# Patient Record
Sex: Male | Born: 1950
Health system: Southern US, Community
[De-identification: ages and names within clinical notes are randomized; demographics above are authoritative.]

## PROBLEM LIST (undated history)

## (undated) DIAGNOSIS — R6882 Decreased libido: Secondary | ICD-10-CM

## (undated) DIAGNOSIS — E119 Type 2 diabetes mellitus without complications: Secondary | ICD-10-CM

## (undated) DIAGNOSIS — E785 Hyperlipidemia, unspecified: Secondary | ICD-10-CM

## (undated) DIAGNOSIS — I10 Essential (primary) hypertension: Secondary | ICD-10-CM

## (undated) HISTORY — DX: Decreased libido: R68.82

## (undated) HISTORY — PX: NO PAST SURGERIES: SHX2092

---

## 2008-10-01 DIAGNOSIS — E7800 Pure hypercholesterolemia, unspecified: Secondary | ICD-10-CM | POA: Insufficient documentation

## 2013-04-10 LAB — HEMOGLOBIN A1C: HEMOGLOBIN A1C: 7.2

## 2015-06-27 ENCOUNTER — Emergency Department
Admission: EM | Admit: 2015-06-27 | Discharge: 2015-06-27 | Disposition: A | Discharge status: Home / Self Care | Payer: PRIVATE HEALTH INSURANCE | Attending: Emergency Medicine | Admitting: Emergency Medicine

## 2015-06-27 ENCOUNTER — Encounter: Payer: Self-pay | Admitting: Emergency Medicine

## 2015-06-27 DIAGNOSIS — Z87891 Personal history of nicotine dependence: Secondary | ICD-10-CM | POA: Diagnosis not present

## 2015-06-27 DIAGNOSIS — S199XXA Unspecified injury of neck, initial encounter: Secondary | ICD-10-CM | POA: Diagnosis present

## 2015-06-27 DIAGNOSIS — E119 Type 2 diabetes mellitus without complications: Secondary | ICD-10-CM | POA: Insufficient documentation

## 2015-06-27 DIAGNOSIS — Y998 Other external cause status: Secondary | ICD-10-CM | POA: Diagnosis not present

## 2015-06-27 DIAGNOSIS — S161XXA Strain of muscle, fascia and tendon at neck level, initial encounter: Secondary | ICD-10-CM | POA: Diagnosis not present

## 2015-06-27 DIAGNOSIS — S39012A Strain of muscle, fascia and tendon of lower back, initial encounter: Secondary | ICD-10-CM | POA: Insufficient documentation

## 2015-06-27 DIAGNOSIS — I10 Essential (primary) hypertension: Secondary | ICD-10-CM | POA: Diagnosis not present

## 2015-06-27 DIAGNOSIS — Y92410 Unspecified street and highway as the place of occurrence of the external cause: Secondary | ICD-10-CM | POA: Insufficient documentation

## 2015-06-27 DIAGNOSIS — Y9389 Activity, other specified: Secondary | ICD-10-CM | POA: Insufficient documentation

## 2015-06-27 HISTORY — DX: Hyperlipidemia, unspecified: E78.5

## 2015-06-27 HISTORY — DX: Type 2 diabetes mellitus without complications: E11.9

## 2015-06-27 HISTORY — DX: Essential (primary) hypertension: I10

## 2015-06-27 MED ORDER — NAPROXEN 500 MG PO TABS: 500.0000 mg | ORAL_TABLET | Freq: Two times a day (BID) | ORAL | Status: DC

## 2015-06-27 MED ORDER — CYCLOBENZAPRINE HCL 5 MG PO TABS: 5.0000 mg | ORAL_TABLET | Freq: Three times a day (TID) | ORAL | Status: DC | PRN
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
## 2015-06-27 NOTE — Discharge Instructions (Signed)
Cervical Sprain A cervical sprain is when the tissues (ligaments) that hold the neck bones in place stretch or tear. HOME CARE   Put ice on the injured area.  Put ice in a plastic bag.  Place a towel between your skin and the bag.  Leave the ice on for 15-20 minutes, 3-4 times a day.  You may have been given a collar to wear. This collar keeps your neck from moving while you heal.  Do not take the collar off unless told by your doctor.  If you have long hair, keep it outside of the collar.  Ask your doctor before changing the position of your collar. You may need to change its position over time to make it more comfortable.  If you are allowed to take off the collar for cleaning or bathing, follow your doctor's instructions on how to do it safely.  Keep your collar clean by wiping it with mild soap and water. Dry it completely. If the collar has removable pads, remove them every 1-2 days to hand wash them with soap and water. Allow them to air dry. They should be dry before you wear them in the collar.  Do not drive while wearing the collar.  Only take medicine as told by your doctor.  Keep all doctor visits as told.  Keep all physical therapy visits as told.  Adjust your work station so that you have good posture while you work.  Avoid positions and activities that make your problems worse.  Warm up and stretch before being active. GET HELP IF:  Your pain is not controlled with medicine.  You cannot take less pain medicine over time as planned.  Your activity level does not improve as expected. GET HELP RIGHT AWAY IF:   You are bleeding.  Your stomach is upset.  You have an allergic reaction to your medicine.  You develop new problems that you cannot explain.  You lose feeling (become numb) or you cannot move any part of your body (paralysis).  You have tingling or weakness in any part of your body.  Your symptoms get worse. Symptoms include:  Pain,  soreness, stiffness, puffiness (swelling), or a burning feeling in your neck.  Pain when your neck is touched.  Shoulder or upper back pain.  Limited ability to move your neck.  Headache.  Dizziness.  Your hands or arms feel week, lose feeling, or tingle.  Muscle spasms.  Difficulty swallowing or chewing. MAKE SURE YOU:   Understand these instructions.  Will watch your condition.  Will get help right away if you are not doing well or get worse. Document Released: 04/26/2008 Document Revised: 07/11/2013 Document Reviewed: 05/16/2013 [facility] Patient Information 2015 [place], [place]. This information is not intended to replace advice given to you by your health care provider. Make sure you discuss any questions you have with your health care provider.  Lumbosacral Strain Lumbosacral strain is a strain of any of the parts that make up your lumbosacral vertebrae. Your lumbosacral vertebrae are the bones that make up the lower third of your backbone. Your lumbosacral vertebrae are held together by muscles and tough, fibrous tissue (ligaments).  CAUSES  A sudden blow to your back can cause lumbosacral strain. Also, anything that causes an excessive stretch of the muscles in the low back can cause this strain. This is typically seen when people exert themselves strenuously, fall, lift heavy objects, bend, or crouch repeatedly. RISK FACTORS  Physically demanding work.  Participation in pushing  or pulling sports or sports that require a sudden twist of the back (tennis, golf, baseball).  Weight lifting.  Excessive lower back curvature.  Forward-tilted pelvis.  Weak back or abdominal muscles or both.  Tight hamstrings. SIGNS AND SYMPTOMS  Lumbosacral strain may cause pain in the area of your injury or pain that moves (radiates) down your leg.  DIAGNOSIS Your health care provider can often diagnose lumbosacral strain through a physical exam. In some cases, you may need tests  such as X-ray exams.  TREATMENT  Treatment for your lower back injury depends on many factors that your clinician will have to evaluate. However, most treatment will include the use of anti-inflammatory medicines. HOME CARE INSTRUCTIONS   Avoid hard physical activities (tennis, racquetball, waterskiing) if you are not in proper physical condition for it. This may aggravate or create problems.  If you have a back problem, avoid sports requiring sudden body movements. Swimming and walking are generally safer activities.  Maintain good posture.  Maintain a healthy weight.  For acute conditions, you may put ice on the injured area.  Put ice in a plastic bag.  Place a towel between your skin and the bag.  Leave the ice on for 20 minutes, 2-3 times a day.  When the low back starts healing, stretching and strengthening exercises may be recommended. SEEK MEDICAL CARE IF:  Your back pain is getting worse.  You experience severe back pain not relieved with medicines. SEEK IMMEDIATE MEDICAL CARE IF:   You have numbness, tingling, weakness, or problems with the use of your arms or legs.  There is a change in bowel or bladder control.  You have increasing pain in any area of the body, including your belly (abdomen).  You notice shortness of breath, dizziness, or feel faint.  You feel sick to your stomach (nauseous), are throwing up (vomiting), or become sweaty.  You notice discoloration of your toes or legs, or your feet get very cold. MAKE SURE YOU:   Understand these instructions.  Will watch your condition.  Will get help right away if you are not doing well or get worse. Document Released: 08/18/2005 Document Revised: 11/13/2013 Document Reviewed: 06/27/2013 [facility] Patient Information 2015 [place], [place]. This information is not intended to replace advice given to you by your health care provider. Make sure you discuss any questions you have with your health care  provider.

## 2015-06-27 NOTE — ED Notes (Signed)
NAD noted at time of D/C. Pt denies questions or concerns. Pt ambulatory to the lobby at this time.  Pt refused wheelchair to the lobby at this time.  

## 2015-08-04 ENCOUNTER — Other Ambulatory Visit: Payer: Self-pay | Admitting: Family Medicine

## 2015-10-07 ENCOUNTER — Other Ambulatory Visit: Payer: Self-pay | Admitting: Family Medicine

## 2015-10-14 ENCOUNTER — Other Ambulatory Visit: Payer: Self-pay | Admitting: Family Medicine

## 2015-11-10 ENCOUNTER — Other Ambulatory Visit: Payer: Self-pay | Admitting: Family Medicine

## 2015-11-20 ENCOUNTER — Other Ambulatory Visit: Payer: Self-pay | Admitting: Family Medicine

## 2015-11-20 ENCOUNTER — Telehealth: Payer: Self-pay | Admitting: Family Medicine

## 2015-11-20 NOTE — Telephone Encounter (Signed)
Last seen 10-01-14: patient is requesting a refill on amlodipine, lisinopril and lovastatin. Stated that he is about to [employer]. I informed him that he need to schedule appointment and he said he will at a later date but said he really need his medication. Please send to [facility]

## 2015-11-27 MED ORDER — LOVASTATIN 40 MG PO TABS: 40.0000 mg | ORAL_TABLET | Freq: Every day | ORAL | Status: DC

## 2015-11-27 MED ORDER — LISINOPRIL-HYDROCHLOROTHIAZIDE 20-25 MG PO TABS: 1.0000 | ORAL_TABLET | Freq: Every day | ORAL | Status: DC

## 2015-11-27 MED ORDER — AMLODIPINE BESYLATE 5 MG PO TABS: 5.0000 mg | ORAL_TABLET | Freq: Every day | ORAL | Status: DC

## 2015-11-27 NOTE — Telephone Encounter (Signed)
Patient  notified he must be seen before any additional refills on any medication 30 days only refill

## 2015-12-08 ENCOUNTER — Other Ambulatory Visit: Payer: Self-pay | Admitting: Family Medicine

## 2016-04-19 ENCOUNTER — Other Ambulatory Visit: Payer: Self-pay | Admitting: Family Medicine

## 2016-04-20 ENCOUNTER — Encounter: Payer: Self-pay | Admitting: Family Medicine

## 2016-04-20 ENCOUNTER — Ambulatory Visit (INDEPENDENT_AMBULATORY_CARE_PROVIDER_SITE_OTHER): Payer: Commercial Managed Care - HMO | Admitting: Family Medicine

## 2016-04-20 VITALS — BP 158/94 | HR 94 | Temp 98.2°F | Resp 18 | Ht 73.0 in | Wt 248.8 lb

## 2016-04-20 DIAGNOSIS — Z125 Encounter for screening for malignant neoplasm of prostate: Secondary | ICD-10-CM | POA: Diagnosis not present

## 2016-04-20 DIAGNOSIS — E1165 Type 2 diabetes mellitus with hyperglycemia: Secondary | ICD-10-CM

## 2016-04-20 DIAGNOSIS — I10 Essential (primary) hypertension: Secondary | ICD-10-CM | POA: Diagnosis not present

## 2016-04-20 DIAGNOSIS — Z23 Encounter for immunization: Secondary | ICD-10-CM

## 2016-04-20 DIAGNOSIS — E7800 Pure hypercholesterolemia, unspecified: Secondary | ICD-10-CM

## 2016-04-20 DIAGNOSIS — Z1211 Encounter for screening for malignant neoplasm of colon: Secondary | ICD-10-CM | POA: Diagnosis not present

## 2016-04-20 DIAGNOSIS — Z87891 Personal history of nicotine dependence: Secondary | ICD-10-CM

## 2016-04-20 LAB — POCT UA - MICROALBUMIN: Microalbumin Ur, POC: 100 mg/L

## 2016-04-20 MED ORDER — METFORMIN HCL 1000 MG PO TABS: 1000.0000 mg | ORAL_TABLET | Freq: Two times a day (BID) | ORAL | Status: DC

## 2016-04-20 MED ORDER — LOVASTATIN 40 MG PO TABS: 40.0000 mg | ORAL_TABLET | Freq: Every day | ORAL | Status: DC

## 2016-04-20 MED ORDER — GLIPIZIDE ER 5 MG PO TB24: 5.0000 mg | ORAL_TABLET | Freq: Every day | ORAL | Status: DC

## 2016-04-20 MED ORDER — LISINOPRIL-HYDROCHLOROTHIAZIDE 20-25 MG PO TABS: 1.0000 | ORAL_TABLET | Freq: Every day | ORAL | Status: DC

## 2016-04-20 MED ORDER — AMLODIPINE BESYLATE 5 MG PO TABS: 5.0000 mg | ORAL_TABLET | Freq: Every day | ORAL | Status: DC

## 2016-04-20 NOTE — Progress Notes (Signed)
Name: Austin Ortega   MRN: [id number]    DOB: [birth year]   Date:04/20/2016       Progress Note  Subjective  Chief Complaint  Chief Complaint  Patient presents with  . Medication Refill    DMV Forms to be filled out and needs refill of medications   . Diabetes    Checks sugar periodically and has not had a medication refill in the past 3 months, in the middle of private insurance to Medicare  . Hypertension    Last time he had BP Medication was in February 2017 for a 30 day supply,  . Hyperlipidemia    HPI  DMV forms : explained to the patient that since he has been out of medication and not seen for months. We will get labs and bring him back to fill out the forms at a later date  DMII: he was taking Janumet, but has been out of medications for at least 4 months. He has noticed nocturia - gets up about 3 times per night. He denies polydipsia or polyphagia. He has not been checking glucose at home. He denies blurred vision. He is taking aspirin daily and is on ace, due for urine micro  HTN: bp is out of control today, but has been out of mediation for at least 4 months, he denies headaches or chest pain  Hyperlipidemia: used to take Mevacor but also out of medication for months.    Patient Active Problem List   Diagnosis Date Noted  . Adiposity 04/20/2016  . Decreased libido 03/04/2009  . Pure hypercholesterolemia 10/01/2008  . Benign essential HTN 08/27/2008  . Type 2 diabetes mellitus with hyperglycemia ([place]) 08/27/2008    History reviewed. No pertinent past surgical history.  Family History  Problem Relation Age of Onset  . Diabetes Mother   . Hypertension Mother   . Cancer Father     Lung  . Hypertension Sister     1 of the patient sister's  . Diabetes Sister     1 of the pateint sisters  . Hypertension Brother     2    Social History   Social History  . Marital Status: Married    Spouse Name: N/A  . Number of Children: N/A  . Years of Education: N/A    Occupational History  . Not on file.   Social History Main Topics  . Smoking status: Former Smoker -- 0.50 packs/day for 3 years    Types: Cigarettes    Start date: 04/20/1978    Quit date: 04/20/1981  . Smokeless tobacco: Never Used  . Alcohol Use: No  . Drug Use: No  . Sexual Activity:    Partners: Female   Other Topics Concern  . Not on file   Social History Narrative     Current outpatient prescriptions:  .  amLODipine (NORVASC) 5 MG tablet, Take 1 tablet (5 mg total) by mouth daily. Patient must be seen before any additional refills on all medications, Disp: 30 tablet, Rfl: 0 .  aspirin (ASPIRIN ADULT LOW DOSE) 81 MG EC tablet, Take by mouth., Disp: , Rfl:  .  Blood Glucose Monitoring Suppl (FREESTYLE FREEDOM LITE) w/Device KIT, , Disp: , Rfl:  .  lisinopril-hydrochlorothiazide (PRINZIDE,ZESTORETIC) 20-25 MG tablet, Take 1 tablet by mouth daily., Disp: 30 tablet, Rfl: 0 .  lovastatin (MEVACOR) 40 MG tablet, Take 1 tablet (40 mg total) by mouth at bedtime., Disp: 30 tablet, Rfl: 0 .  glipiZIDE (GLUCOTROL XL) 5 MG 24  hr tablet, Take 1 tablet (5 mg total) by mouth daily with breakfast., Disp: 30 tablet, Rfl: 0 .  metFORMIN (GLUCOPHAGE) 1000 MG tablet, Take 1 tablet (1,000 mg total) by mouth 2 (two) times daily with a meal., Disp: 60 tablet, Rfl: 0  Allergies  Allergen Reactions  . Shrimp [Shellfish Allergy] Swelling     ROS  Ten systems reviewed and is negative except as mentioned in HPI  Objective  Filed Vitals:   04/20/16 1208  BP: 158/94  Pulse: 94  Temp: 98.2 F (36.8 C)  TempSrc: Oral  Resp: 18  Height: '6\' 1"'  (1.854 m)  Weight: 248 lb 12.8 oz (112.855 kg)  SpO2: 97%    Body mass index is 32.83 kg/(m^2).  Physical Exam  Constitutional: Patient appears well-developed and well-nourished. Obese No distress.  HEENT: head atraumatic, normocephalic, pupils equal and reactive to light,  neck supple, throat within normal limits Cardiovascular: Normal  rate, regular rhythm and normal heart sounds.  No murmur heard. No BLE edema. Pulmonary/Chest: Effort normal and breath sounds normal. No respiratory distress. Abdominal: Soft.  There is no tenderness. Psychiatric: Patient has a normal mood and affect. behavior is normal. Judgment and thought content normal.  Diabetic Foot Exam: Diabetic Foot Exam - Simple   Simple Foot Form  Diabetic Foot exam was performed with the following findings:  Yes 04/20/2016 12:39 PM  Visual Inspection  No deformities, no ulcerations, no other skin breakdown bilaterally:  Yes  Sensation Testing  Intact to touch and monofilament testing bilaterally:  Yes  Pulse Check  Posterior Tibialis and Dorsalis pulse intact bilaterally:  Yes  Comments       PHQ2/9: Depression screen PHQ 2/9 04/20/2016  Decreased Interest 0  Down, Depressed, Hopeless 0  PHQ - 2 Score 0     Fall Risk: Fall Risk  04/20/2016  Falls in the past year? No      Functional Status Survey: Is the patient deaf or have difficulty hearing?: No Does the patient have difficulty seeing, even when wearing glasses/contacts?: No Does the patient have difficulty concentrating, remembering, or making decisions?: No Does the patient have difficulty walking or climbing stairs?: No Does the patient have difficulty dressing or bathing?: No Does the patient have difficulty doing errands alone such as visiting a doctor's office or shopping?: No    Assessment & Plan  1. Benign essential HTN  Resume medication, discussed importance of compliance - Comprehensive metabolic panel - CBC with Differential/Platelet - lisinopril-hydrochlorothiazide (PRINZIDE,ZESTORETIC) 20-25 MG tablet; Take 1 tablet by mouth daily.  Dispense: 30 tablet; Refill: 0 - amLODipine (NORVASC) 5 MG tablet; Take 1 tablet (5 mg total) by mouth daily. Patient must be seen before any additional refills on all medications  Dispense: 30 tablet; Refill: 0  2. Pure  hypercholesterolemia  - Lipid panel - lovastatin (MEVACOR) 40 MG tablet; Take 1 tablet (40 mg total) by mouth at bedtime.  Dispense: 30 tablet; Refill: 0  3. Type 2 diabetes mellitus with hyperglycemia, without long-term current use of insulin (HCC)  - Hemoglobin A1c - POCT UA - Microalbumin - metFORMIN (GLUCOPHAGE) 1000 MG tablet; Take 1 tablet (1,000 mg total) by mouth 2 (two) times daily with a meal.  Dispense: 60 tablet; Refill: 0 - glipiZIDE (GLUCOTROL XL) 5 MG 24 hr tablet; Take 1 tablet (5 mg total) by mouth daily with breakfast.  Dispense: 30 tablet; Refill: 0  4. Need for pneumococcal vaccination  - Pneumococcal polysaccharide vaccine 23-valent greater than or equal to 2yo  subcutaneous/IM  5. Need for shingles vaccine  - Varicella-zoster vaccine subcutaneous - not given because of cost  6. Colon cancer screening  - Ambulatory referral to Gastroenterology  7. History of tobacco use  - US ABDOMINAL AORTA SCREENING AAA; Future

## 2016-04-21 ENCOUNTER — Encounter: Payer: Self-pay | Admitting: Family Medicine

## 2016-04-21 ENCOUNTER — Ambulatory Visit (INDEPENDENT_AMBULATORY_CARE_PROVIDER_SITE_OTHER): Payer: Commercial Managed Care - HMO | Admitting: Family Medicine

## 2016-04-21 VITALS — BP 142/76 | HR 98 | Temp 98.2°F | Resp 18 | Ht 73.0 in | Wt 247.7 lb

## 2016-04-21 DIAGNOSIS — I10 Essential (primary) hypertension: Secondary | ICD-10-CM

## 2016-04-21 DIAGNOSIS — R809 Proteinuria, unspecified: Secondary | ICD-10-CM

## 2016-04-21 DIAGNOSIS — N481 Balanitis: Secondary | ICD-10-CM | POA: Diagnosis not present

## 2016-04-21 DIAGNOSIS — Z125 Encounter for screening for malignant neoplasm of prostate: Secondary | ICD-10-CM

## 2016-04-21 DIAGNOSIS — Z0000 Encounter for general adult medical examination without abnormal findings: Secondary | ICD-10-CM

## 2016-04-21 DIAGNOSIS — L80 Vitiligo: Secondary | ICD-10-CM | POA: Diagnosis not present

## 2016-04-21 DIAGNOSIS — E1129 Type 2 diabetes mellitus with other diabetic kidney complication: Secondary | ICD-10-CM | POA: Diagnosis not present

## 2016-04-21 DIAGNOSIS — K429 Umbilical hernia without obstruction or gangrene: Secondary | ICD-10-CM

## 2016-04-21 LAB — COMPREHENSIVE METABOLIC PANEL
ALBUMIN: 4.2 g/dL (ref 3.6–4.8)
ALK PHOS: 110 IU/L (ref 39–117)
ALT: 22 IU/L (ref 0–44)
AST: 19 IU/L (ref 0–40)
Albumin/Globulin Ratio: 1.4 (ref 1.2–2.2)
BILIRUBIN TOTAL: 0.7 mg/dL (ref 0.0–1.2)
BUN/Creatinine Ratio: 12 (ref 10–24)
BUN: 17 mg/dL (ref 8–27)
CO2: 22 mmol/L (ref 18–29)
Calcium: 9.4 mg/dL (ref 8.6–10.2)
Chloride: 99 mmol/L (ref 96–106)
Creatinine, Ser: 1.44 mg/dL — ABNORMAL HIGH (ref 0.76–1.27)
GFR calc non Af Amer: 51 mL/min/{1.73_m2} — ABNORMAL LOW (ref >59)
GFR, EST AFRICAN AMERICAN: 58 mL/min/{1.73_m2} — AB (ref >59)
GLUCOSE: 274 mg/dL — AB (ref 65–99)
Globulin, Total: 2.9 g/dL (ref 1.5–4.5)
POTASSIUM: 4.4 mmol/L (ref 3.5–5.2)
Sodium: 138 mmol/L (ref 134–144)
Total Protein: 7.1 g/dL (ref 6.0–8.5)

## 2016-04-21 LAB — CBC WITH DIFFERENTIAL/PLATELET
BASOS ABS: 0 10*3/uL (ref 0.0–0.2)
Basos: 0 %
EOS (ABSOLUTE): 0.2 10*3/uL (ref 0.0–0.4)
Eos: 3 %
HEMOGLOBIN: 13.7 g/dL (ref 12.6–17.7)
Hematocrit: 42.4 % (ref 37.5–51.0)
Immature Grans (Abs): 0 10*3/uL (ref 0.0–0.1)
Immature Granulocytes: 0 %
LYMPHS ABS: 1.3 10*3/uL (ref 0.7–3.1)
Lymphs: 27 %
MCH: 27.2 pg (ref 26.6–33.0)
MCHC: 32.3 g/dL (ref 31.5–35.7)
MCV: 84 fL (ref 79–97)
MONOCYTES: 9 %
Monocytes Absolute: 0.5 10*3/uL (ref 0.1–0.9)
NEUTROS ABS: 3 10*3/uL (ref 1.4–7.0)
Neutrophils: 61 %
Platelets: 208 10*3/uL (ref 150–379)
RBC: 5.04 x10E6/uL (ref 4.14–5.80)
RDW: 14.2 % (ref 12.3–15.4)
WBC: 4.9 10*3/uL (ref 3.4–10.8)

## 2016-04-21 LAB — HEMOGLOBIN A1C
ESTIMATED AVERAGE GLUCOSE: 369 mg/dL
HEMOGLOBIN A1C: 14.5 % — AB (ref 4.8–5.6)

## 2016-04-21 LAB — LIPID PANEL
CHOL/HDL RATIO: 4.3 ratio (ref 0.0–5.0)
Cholesterol, Total: 230 mg/dL — ABNORMAL HIGH (ref 100–199)
HDL: 54 mg/dL (ref >39)
LDL Calculated: 154 mg/dL — ABNORMAL HIGH (ref 0–99)
TRIGLYCERIDES: 109 mg/dL (ref 0–149)
VLDL Cholesterol Cal: 22 mg/dL (ref 5–40)

## 2016-04-21 MED ORDER — FLUCONAZOLE 150 MG PO TABS: 150.0000 mg | ORAL_TABLET | ORAL | Status: DC

## 2016-04-21 NOTE — Progress Notes (Signed)
Name: Austin Ortega   MRN: [id number]    DOB: 1950-12-24   Date:04/21/2016       Progress Note  Subjective  Chief Complaint  Chief Complaint  Patient presents with  . Medicare Wellness  . Diabetes  . Hypertension    HPI  Functional ability/safety issues: No Issues Hearing issues: Addressed  Activities of daily living: Discussed Home safety issues: No Issues  End Of Life Planning: Offered verbal information regarding advanced directives, healthcare power of attorney.  Preventative care, Health maintenance, Preventative health measures discussed.  Preventative screenings discussed today: lab work, colonoscopy, PSA.  Men age 64 to 12 years if ever smoked recommended to get a one time AAA ultrasound screening exam.  Low Dose CT Chest recommended if Age 54-80 years, 30 pack-year currently smoking OR have quit w/in 15years.   Lifestyle risk factor issued reviewed: Diet, exercise, weight management, advised patient smoking is not healthy, nutrition/diet.  Preventative health measures discussed (5-10 year plan).  Reviewed and recommended vaccinations: - Pneumovax  - Prevnar  - Annual Influenza - Zostavax - Tdap   Depression screening: Done Fall risk screening: Done Discuss ADLs/IADLs: Done  Current medical providers: See HPI  Other health risk factors identified this visit: No other issues Cognitive impairment issues: None identified  All above discussed with patient. Appropriate education, counseling and referral will be made based upon the above.    DMII:seen yesterday and hgbA1C is over 14. He was out of medications for months. He had nocturia, but no polydipsia or polyphagia. He states since he ran out of medication he has been having sweet beverages about 3 times daily, not compliant with diabetic diet, but he knows what is appropriate ( he had a 6 week class at church about 2 months ago). He is afraid of insulin shots, but will change his diet and will take  medications until his follow up.   HTN: he is now back on medication, bp has improved, continue medication, no chest pain or palpitation, denies decrease in exercise tolerance  Hyperlipidemia:LDL out of control, he has resume statin therapy, needs to take aspirin daily   Patient Active Problem List   Diagnosis Date Noted  . Adiposity 04/20/2016  . Decreased libido 03/04/2009  . Pure hypercholesterolemia 10/01/2008  . Benign essential HTN 08/27/2008  . Type 2 diabetes mellitus with microalbuminuria ([place]) 08/27/2008    History reviewed. No pertinent past surgical history.  Family History  Problem Relation Age of Onset  . Diabetes Mother   . Hypertension Mother   . Cancer Father     Lung  . Hypertension Sister     1 of the patient sister's  . Diabetes Sister     1 of the pateint sisters  . Hypertension Brother     2    Social History   Social History  . Marital Status: Married    Spouse Name: N/A  . Number of Children: N/A  . Years of Education: N/A   Occupational History  . Not on file.   Social History Main Topics  . Smoking status: Former Smoker -- 0.50 packs/day for 3 years    Types: Cigarettes    Start date: 04/20/1978    Quit date: 04/20/1981  . Smokeless tobacco: Never Used  . Alcohol Use: No  . Drug Use: No  . Sexual Activity:    Partners: Female   Other Topics Concern  . Not on file   Social History Narrative     Current outpatient prescriptions:  .  amLODipine (NORVASC) 5 MG tablet, Take 1 tablet (5 mg total) by mouth daily. Patient must be seen before any additional refills on all medications, Disp: 30 tablet, Rfl: 0 .  aspirin (ASPIRIN ADULT LOW DOSE) 81 MG EC tablet, Take by mouth., Disp: , Rfl:  .  Blood Glucose Monitoring Suppl (FREESTYLE FREEDOM LITE) w/Device KIT, , Disp: , Rfl:  .  glipiZIDE (GLUCOTROL XL) 5 MG 24 hr tablet, Take 1 tablet (5 mg total) by mouth daily with breakfast., Disp: 30 tablet, Rfl: 0 .   lisinopril-hydrochlorothiazide (PRINZIDE,ZESTORETIC) 20-25 MG tablet, Take 1 tablet by mouth daily., Disp: 30 tablet, Rfl: 0 .  lovastatin (MEVACOR) 40 MG tablet, Take 1 tablet (40 mg total) by mouth at bedtime., Disp: 30 tablet, Rfl: 0 .  metFORMIN (GLUCOPHAGE) 1000 MG tablet, Take 1 tablet (1,000 mg total) by mouth 2 (two) times daily with a meal., Disp: 60 tablet, Rfl: 0  Allergies  Allergen Reactions  . Shrimp [Shellfish Allergy] Swelling     ROS  Constitutional: Negative for fever or weight change.  Respiratory: Negative for cough and shortness of breath.   Cardiovascular: Negative for chest pain or palpitations.  Gastrointestinal: Negative for abdominal pain, no bowel changes.  Musculoskeletal: Negative for gait problem or joint swelling.  Skin: Negative for rash.  Neurological: Negative for dizziness or headache.  No other specific complaints in a complete review of systems (except as listed in HPI above).  Objective  Filed Vitals:   04/21/16 1136  BP: 142/76  Pulse: 98  Temp: 98.2 F (36.8 C)  TempSrc: Oral  Resp: 18  Height: '6\' 1"'  (1.854 m)  Weight: 247 lb 11.2 oz (112.356 kg)  SpO2: 98%    Body mass index is 32.69 kg/(m^2).  Physical Exam  Constitutional: Patient appears well-developed and well-nourished. No distress.  HENT: Head: Normocephalic and atraumatic. Ears: B TMs ok, no erythema or effusion; Nose: Nose normal. Mouth/Throat: Oropharynx is clear and moist. No oropharyngeal exudate.  Eyes: Conjunctivae and EOM are normal. Pupils are equal, round, and reactive to light. No scleral icterus.  Neck: Normal range of motion. Neck supple. No JVD present. No thyromegaly present.  Cardiovascular: Normal rate, regular rhythm and normal heart sounds.  No murmur heard. No BLE edema. Pulmonary/Chest: Effort normal and breath sounds normal. No respiratory distress. Abdominal: Soft. Bowel sounds are normal, no distension. There is no tenderness. He has a large umbilical  hernia, but states not problems in many years MALE GENITALIA: Normal descended testes bilaterally, no masses palpated, no hernias, no lesions, some changes from candidiasis, also has some erythema on right side of gland of penis RECTAL: Prostate normal size and consistency, no rectal masses or hemorrhoids Musculoskeletal: Normal range of motion, no joint effusions. No gross deformities Neurological: he is alert and oriented to person, place, and time. No cranial nerve deficit. Coordination, balance, strength, speech and gait are normal.  Skin: Skin is warm and dry. Vitiligo on penis.  Erythema on penis Psychiatric: Patient has a normal mood and affect. behavior is normal. Judgment and thought content normal.   Visual Acuity Screening   Right eye Left eye Both eyes  Without correction: '20/30 20/30 20/30 '  With correction:       Recent Results (from the past 2160 hour(s))  POCT UA - Microalbumin     Status: Abnormal   Collection Time: 04/20/16 12:55 PM  Result Value Ref Range   Microalbumin Ur, POC 100 mg/L   Creatinine, POC  mg/dL   Albumin/Creatinine  Ratio, Urine, POC    Lipid panel     Status: Abnormal   Collection Time: 04/20/16  3:08 PM  Result Value Ref Range   Cholesterol, Total 230 (H) 100 - 199 mg/dL   Triglycerides 109 0 - 149 mg/dL   HDL 54 >39 mg/dL   VLDL Cholesterol Cal 22 5 - 40 mg/dL   LDL Calculated 154 (H) 0 - 99 mg/dL   Chol/HDL Ratio 4.3 0.0 - 5.0 ratio units    Comment:                                   T. Chol/HDL Ratio                                             Men  Women                               1/2 Avg.Risk  3.4    3.3                                   Avg.Risk  5.0    4.4                                2X Avg.Risk  9.6    7.1                                3X Avg.Risk 23.4   11.0   Hemoglobin A1c     Status: Abnormal   Collection Time: 04/20/16  3:08 PM  Result Value Ref Range   Hgb A1c MFr Bld 14.5 (H) 4.8 - 5.6 %    Comment:           Pre-diabetes: 5.7 - 6.4          Diabetes: >6.4          Glycemic control for adults with diabetes: <7.0    Est. average glucose Bld gHb Est-mCnc 369 mg/dL  Comprehensive metabolic panel     Status: Abnormal   Collection Time: 04/20/16  3:08 PM  Result Value Ref Range   Glucose 274 (H) 65 - 99 mg/dL   BUN 17 8 - 27 mg/dL   Creatinine, Ser 1.44 (H) 0.76 - 1.27 mg/dL   GFR calc non Af Amer 51 (L) >59 mL/min/1.73   GFR calc Af Amer 58 (L) >59 mL/min/1.73   BUN/Creatinine Ratio 12 10 - 24   Sodium 138 134 - 144 mmol/L   Potassium 4.4 3.5 - 5.2 mmol/L   Chloride 99 96 - 106 mmol/L   CO2 22 18 - 29 mmol/L   Calcium 9.4 8.6 - 10.2 mg/dL   Total Protein 7.1 6.0 - 8.5 g/dL   Albumin 4.2 3.6 - 4.8 g/dL   Globulin, Total 2.9 1.5 - 4.5 g/dL   Albumin/Globulin Ratio 1.4 1.2 - 2.2   Bilirubin Total 0.7 0.0 - 1.2 mg/dL   Alkaline Phosphatase 110 39 - 117 IU/L   AST 19 0 - 40 IU/L   ALT 22 0 -  44 IU/L  CBC with Differential/Platelet     Status: None   Collection Time: 04/20/16  3:08 PM  Result Value Ref Range   WBC 4.9 3.4 - 10.8 x10E3/uL   RBC 5.04 4.14 - 5.80 x10E6/uL   Hemoglobin 13.7 12.6 - 17.7 g/dL   Hematocrit 42.4 37.5 - 51.0 %   MCV 84 79 - 97 fL   MCH 27.2 26.6 - 33.0 pg   MCHC 32.3 31.5 - 35.7 g/dL   RDW 14.2 12.3 - 15.4 %   Platelets 208 150 - 379 x10E3/uL   Neutrophils 61 %   Lymphs 27 %   Monocytes 9 %   Eos 3 %   Basos 0 %   Neutrophils Absolute 3.0 1.4 - 7.0 x10E3/uL   Lymphocytes Absolute 1.3 0.7 - 3.1 x10E3/uL   Monocytes Absolute 0.5 0.1 - 0.9 x10E3/uL   EOS (ABSOLUTE) 0.2 0.0 - 0.4 x10E3/uL   Basophils Absolute 0.0 0.0 - 0.2 x10E3/uL   Immature Granulocytes 0 %   Immature Grans (Abs) 0.0 0.0 - 0.1 x10E3/uL    PHQ2/9: Depression screen PHQ 2/9 04/20/2016  Decreased Interest 0  Down, Depressed, Hopeless 0  PHQ - 2 Score 0     Fall Risk: Fall Risk  04/20/2016  Falls in the past year? No     Assessment & Plan  1. Welcome to Medicare preventive  visit  - EKG 12-Lead - Visual acuity screening Discussed importance of 150 minutes of physical activity weekly, eat two servings of fish weekly, eat one serving of tree nuts ( cashews, pistachios, pecans, almonds.Marland Kitchen) every other day, eat 6 servings of fruit/vegetables daily and drink plenty of water and avoid sweet beverages.   2. Prostate cancer screening  - PSA ( add on )  3. Benign essential HTN  - EKG 12-Lead  4. Type 2 diabetes mellitus with microalbuminuria, without long-term current use of insulin (HCC)  He will return in 2 months with glucose log to see if we will have to start insulin. He is going to change his diet and exercise more  5. Umbilical hernia without obstruction and without gangrene  Discussed signs of infection and incarceration and need to go to [facility] if it happens   6. Balanitis  - fluconazole (DIFLUCAN) 150 MG tablet; Take 1 tablet (150 mg total) by mouth every other day.  Dispense: 3 tablet; Refill: 0  7. Vitiligo  Also has some erythema on right side of gland, non tender, per patient present for years and unchanged

## 2016-04-22 LAB — SPECIMEN STATUS REPORT

## 2016-04-22 LAB — PSA: PROSTATE SPECIFIC AG, SERUM: 0.5 ng/mL (ref 0.0–4.0)

## 2016-05-12 ENCOUNTER — Ambulatory Visit
Admission: RE | Admit: 2016-05-12 | Discharge: 2016-05-12 | Disposition: A | Discharge status: Home / Self Care | Payer: Commercial Managed Care - HMO | Source: Ambulatory Visit | Attending: Family Medicine | Admitting: Family Medicine

## 2016-05-12 DIAGNOSIS — Z136 Encounter for screening for cardiovascular disorders: Secondary | ICD-10-CM | POA: Diagnosis not present

## 2016-05-12 DIAGNOSIS — Z87891 Personal history of nicotine dependence: Secondary | ICD-10-CM | POA: Diagnosis not present

## 2016-05-24 ENCOUNTER — Other Ambulatory Visit: Payer: Self-pay | Admitting: Family Medicine

## 2016-05-24 NOTE — Telephone Encounter (Signed)
Patient requesting refill. 

## 2016-05-26 NOTE — Telephone Encounter (Signed)
Patient informed and appointment made for end of August. He wanted to come in sooner than 3-4 months

## 2016-07-21 ENCOUNTER — Encounter: Payer: Self-pay | Admitting: Family Medicine

## 2016-07-21 ENCOUNTER — Ambulatory Visit (INDEPENDENT_AMBULATORY_CARE_PROVIDER_SITE_OTHER): Payer: Commercial Managed Care - HMO | Admitting: Family Medicine

## 2016-07-21 VITALS — BP 122/72 | HR 87 | Temp 97.8°F | Resp 18 | Ht 73.0 in | Wt 243.0 lb

## 2016-07-21 DIAGNOSIS — E785 Hyperlipidemia, unspecified: Secondary | ICD-10-CM

## 2016-07-21 DIAGNOSIS — I10 Essential (primary) hypertension: Secondary | ICD-10-CM

## 2016-07-21 DIAGNOSIS — E1129 Type 2 diabetes mellitus with other diabetic kidney complication: Secondary | ICD-10-CM

## 2016-07-21 DIAGNOSIS — Z23 Encounter for immunization: Secondary | ICD-10-CM | POA: Diagnosis not present

## 2016-07-21 DIAGNOSIS — R809 Proteinuria, unspecified: Secondary | ICD-10-CM | POA: Diagnosis not present

## 2016-07-21 DIAGNOSIS — Z1211 Encounter for screening for malignant neoplasm of colon: Secondary | ICD-10-CM | POA: Diagnosis not present

## 2016-07-21 MED ORDER — AMLODIPINE BESYLATE 5 MG PO TABS: 5.0000 mg | ORAL_TABLET | Freq: Every day | ORAL | 3 refills | Status: DC

## 2016-07-21 MED ORDER — METFORMIN HCL 1000 MG PO TABS: 1000.0000 mg | ORAL_TABLET | Freq: Two times a day (BID) | ORAL | 3 refills | Status: DC

## 2016-07-21 MED ORDER — ATORVASTATIN CALCIUM 40 MG PO TABS: 40.0000 mg | ORAL_TABLET | Freq: Every day | ORAL | 3 refills | Status: DC

## 2016-07-21 MED ORDER — GLIPIZIDE ER 5 MG PO TB24: ORAL_TABLET | ORAL | 3 refills | Status: DC

## 2016-07-21 MED ORDER — LISINOPRIL-HYDROCHLOROTHIAZIDE 20-25 MG PO TABS: 1.0000 | ORAL_TABLET | Freq: Every day | ORAL | 3 refills | Status: DC

## 2016-07-21 NOTE — Progress Notes (Signed)
Name: Austin Ortega   MRN: [id number]    DOB: 04/14/1951   Date:07/21/2016       Progress Note  Subjective  Chief Complaint  Chief Complaint  Patient presents with  . Medication Refill  . Hypertension  . Hyperlipidemia  . Diabetes    HPI  DMII back in May hgbA1C was above 14 also had 100 of microalbumin.  He was out of medications for months.He resumed Metformin and added Glucotrol XL , he states he is feeling well, occasionally has nocturia, no polyphagia or polydipsia, glucose at home is between 120's since he resumed medication. No hypoglycemic episodes. He also lost 5 lbs since last visit. He would like to continue current regiment and focus even more on diet instead of changing or adding medications to his regiment.  HTN: he is now back on medication, bp has improved, continue medication, no chest pain or palpitation, denies decrease in exercise tolerance - he plays golf but rides the golf cart.   Hyperlipidemia:LDL out of control, he has resume statin therapy, needs to take aspirin daily . We will change from Mevacor to Atorvastatin    Patient Active Problem List   Diagnosis Date Noted  . Umbilical hernia without obstruction and without gangrene 04/21/2016  . Balanitis 04/21/2016  . Vitiligo 04/21/2016  . Adiposity 04/20/2016  . Decreased libido 03/04/2009  . Pure hypercholesterolemia 10/01/2008  . Benign essential HTN 08/27/2008  . Type 2 diabetes mellitus with microalbuminuria ([place]) 08/27/2008    History reviewed. No pertinent surgical history.  Family History  Problem Relation Age of Onset  . Diabetes Mother   . Hypertension Mother   . Cancer Father     Lung  . Hypertension Sister     1 of the patient sister's  . Diabetes Sister     1 of the pateint sisters  . Hypertension Brother     2    Social History   Social History  . Marital status: Married    Spouse name: N/A  . Number of children: N/A  . Years of education: N/A   Occupational History  .  Not on file.   Social History Main Topics  . Smoking status: Former Smoker    Packs/day: 0.50    Years: 3.00    Types: Cigarettes    Start date: 04/20/1978    Quit date: 04/20/1981  . Smokeless tobacco: Never Used  . Alcohol use No  . Drug use: No  . Sexual activity: Yes    Partners: Female   Other Topics Concern  . Not on file   Social History Narrative  . No narrative on file     Current Outpatient Prescriptions:  .  amLODipine (NORVASC) 5 MG tablet, Take 1 tablet (5 mg total) by mouth daily., Disp: 30 tablet, Rfl: 3 .  aspirin (ASPIRIN ADULT LOW DOSE) 81 MG EC tablet, Take by mouth., Disp: , Rfl:  .  atorvastatin (LIPITOR) 40 MG tablet, Take 1 tablet (40 mg total) by mouth daily., Disp: 30 tablet, Rfl: 3 .  Blood Glucose Monitoring Suppl (FREESTYLE FREEDOM LITE) w/Device KIT, , Disp: , Rfl:  .  glipiZIDE (GLUCOTROL XL) 5 MG 24 hr tablet, TAKE ONE TABLET BY MOUTH ONCE DAILY WITH BREAKFAST, Disp: 30 tablet, Rfl: 3 .  lisinopril-hydrochlorothiazide (PRINZIDE,ZESTORETIC) 20-25 MG tablet, Take 1 tablet by mouth daily., Disp: 30 tablet, Rfl: 3 .  metFORMIN (GLUCOPHAGE) 1000 MG tablet, Take 1 tablet (1,000 mg total) by mouth 2 (two) times daily with a  meal., Disp: 60 tablet, Rfl: 3  Allergies  Allergen Reactions  . Shrimp [Shellfish Allergy] Swelling     ROS  Constitutional: Negative for fever and mild weight change.  Respiratory: Negative for cough and shortness of breath.   Cardiovascular: Negative for chest pain or palpitations.  Gastrointestinal: Negative for abdominal pain, no bowel changes.  Musculoskeletal: Negative for gait problem or joint swelling.  Skin: Negative for rash.  Neurological: Negative for dizziness or headache.  No other specific complaints in a complete review of systems (except as listed in HPI above).  Objective  Vitals:   07/21/16 0809  BP: 122/72  Pulse: 87  Resp: 18  Temp: 97.8 F (36.6 C)  SpO2: 97%  Weight: 243 lb (110.2 kg)   Height: '6\' 1"'  (1.854 m)    Body mass index is 32.06 kg/m.  Physical Exam  Constitutional: Patient appears well-developed and well-nourished. Obese  No distress.  HEENT: head atraumatic, normocephalic, pupils equal and reactive to light, neck supple, throat within normal limits Cardiovascular: Normal rate, regular rhythm and normal heart sounds.  No murmur heard. No BLE edema. Pulmonary/Chest: Effort normal and breath sounds normal. No respiratory distress. Abdominal: Soft.  There is no tenderness. Psychiatric: Patient has a normal mood and affect. behavior is normal. Judgment and thought content normal.  PHQ2/9: Depression screen [facility] 2/9 07/21/2016 04/20/2016  Decreased Interest 0 0  Down, Depressed, Hopeless 0 0  PHQ - 2 Score 0 0    Fall Risk: Fall Risk  07/21/2016 04/20/2016  Falls in the past year? No No     Functional Status Survey: Is the patient deaf or have difficulty hearing?: No Does the patient have difficulty seeing, even when wearing glasses/contacts?: No Does the patient have difficulty concentrating, remembering, or making decisions?: No Does the patient have difficulty walking or climbing stairs?: No Does the patient have difficulty dressing or bathing?: No Does the patient have difficulty doing errands alone such as visiting a doctor's office or shopping?: No    Assessment & Plan  1. Type 2 diabetes mellitus with microalbuminuria, without long-term current use of insulin (HCC)  Continue the hard work, doing great, glucose has improved - glipiZIDE (GLUCOTROL XL) 5 MG 24 hr tablet; TAKE ONE TABLET BY MOUTH ONCE DAILY WITH BREAKFAST  Dispense: 30 tablet; Refill: 3 - lisinopril-hydrochlorothiazide (PRINZIDE,ZESTORETIC) 20-25 MG tablet; Take 1 tablet by mouth daily.  Dispense: 30 tablet; Refill: 3 - metFORMIN (GLUCOPHAGE) 1000 MG tablet; Take 1 tablet (1,000 mg total) by mouth 2 (two) times daily with a meal.  Dispense: 60 tablet; Refill: 3 - Ambulatory  referral to Ophthalmology  2. Benign essential HTN  - amLODipine (NORVASC) 5 MG tablet; Take 1 tablet (5 mg total) by mouth daily.  Dispense: 30 tablet; Refill: 3 - lisinopril-hydrochlorothiazide (PRINZIDE,ZESTORETIC) 20-25 MG tablet; Take 1 tablet by mouth daily.  Dispense: 30 tablet; Refill: 3  3. Need for pneumococcal vaccination  - Pneumococcal polysaccharide vaccine 23-valent greater than or equal to 2yo subcutaneous/IM  4. Dyslipidemia  - atorvastatin (LIPITOR) 40 MG tablet; Take 1 tablet (40 mg total) by mouth daily.  Dispense: 30 tablet; Refill: 3  5. Needs flu shot  - Flu vaccine HIGH DOSE PF  6. Colon cancer screening  - Ambulatory referral to Gastroenterology

## 2016-07-30 ENCOUNTER — Telehealth: Payer: Self-pay | Admitting: Family Medicine

## 2016-07-30 ENCOUNTER — Telehealth: Payer: Self-pay | Admitting: General Surgery

## 2016-07-30 ENCOUNTER — Encounter: Payer: Self-pay | Admitting: Family Medicine

## 2016-07-30 NOTE — Telephone Encounter (Signed)
LVM on patient's cell for him to call our offices in regards information about his colonoscopy and eye exam referral.

## 2016-07-30 NOTE — Telephone Encounter (Signed)
07-30-16 L/M ON PTS C&H#'S AROUND 11:55AM TO CALL BACK AND SCHEDULE AN APPT WITH [place] (DR Jamal Collin ON CALL WHEN REF CAME IN)FOR A SCREENING COLONOSCOPY.PLEASE CHECK TO SEE IF PT HAS HAD ANY IN PAST.PT WILL ALSO NEED A [employer] THN REF!!!

## 2016-11-23 ENCOUNTER — Ambulatory Visit: Payer: Commercial Managed Care - HMO | Admitting: Family Medicine

## 2016-12-28 ENCOUNTER — Ambulatory Visit: Payer: Commercial Managed Care - HMO | Admitting: Family Medicine

## 2017-01-18 ENCOUNTER — Ambulatory Visit (INDEPENDENT_AMBULATORY_CARE_PROVIDER_SITE_OTHER): Payer: Commercial Managed Care - HMO | Admitting: Family Medicine

## 2017-01-18 ENCOUNTER — Encounter: Payer: Self-pay | Admitting: Family Medicine

## 2017-01-18 VITALS — BP 122/72 | HR 81 | Temp 98.0°F | Resp 16 | Ht 73.0 in | Wt 251.2 lb

## 2017-01-18 DIAGNOSIS — E1129 Type 2 diabetes mellitus with other diabetic kidney complication: Secondary | ICD-10-CM

## 2017-01-18 DIAGNOSIS — I10 Essential (primary) hypertension: Secondary | ICD-10-CM | POA: Diagnosis not present

## 2017-01-18 DIAGNOSIS — E785 Hyperlipidemia, unspecified: Secondary | ICD-10-CM

## 2017-01-18 DIAGNOSIS — R809 Proteinuria, unspecified: Secondary | ICD-10-CM

## 2017-01-18 LAB — POCT GLYCOSYLATED HEMOGLOBIN (HGB A1C): Hemoglobin A1C: 8.5

## 2017-01-18 MED ORDER — METFORMIN HCL ER (MOD) 1000 MG PO TB24: 1000.0000 mg | ORAL_TABLET | Freq: Every day | ORAL | 3 refills | Status: DC

## 2017-01-18 MED ORDER — LISINOPRIL-HYDROCHLOROTHIAZIDE 20-25 MG PO TABS: 1.0000 | ORAL_TABLET | Freq: Every day | ORAL | 3 refills | Status: DC

## 2017-01-18 MED ORDER — AMLODIPINE BESYLATE 5 MG PO TABS: 5.0000 mg | ORAL_TABLET | Freq: Every day | ORAL | 3 refills | Status: DC

## 2017-01-18 MED ORDER — GLIPIZIDE ER 5 MG PO TB24: ORAL_TABLET | ORAL | 3 refills | Status: DC

## 2017-01-18 MED ORDER — ATORVASTATIN CALCIUM 40 MG PO TABS: 40.0000 mg | ORAL_TABLET | Freq: Every day | ORAL | 3 refills | Status: DC

## 2017-01-18 NOTE — Progress Notes (Signed)
Name: Austin Ortega   MRN: [id number]    DOB: [birth year]   Date:01/18/2017       Progress Note  Subjective  Chief Complaint  Chief Complaint  Patient presents with  . Diabetes    4 month follow up not checking on reugular basis 110-140   . Hypertension    HPI  DMII : hgbA1C has gone down from 14.5 % in May to 8.5% today. also had 100 of microalbumin.He resumed Metformin and added Glucotrol XL back in May 2017 , he states he is feeling well, he still has nocturia about twice per night, no polyphagia or polydipsia, glucose at home is not above 150.  No hypoglycemic episodes. He has gained weight since last visit, and needs to resume physical activity. He is taking ACE for microalbuminuria  HTN: he is now back on medication, bp is at goal,  no chest pain or palpitation, denies decrease in exercise tolerance   Hyperlipidemia: he is back on statin therapy, we will recheck labs on his next visit. No myalgia  Patient Active Problem List   Diagnosis Date Noted  . Umbilical hernia without obstruction and without gangrene 04/21/2016  . Balanitis 04/21/2016  . Vitiligo 04/21/2016  . Adiposity 04/20/2016  . Decreased libido 03/04/2009  . Pure hypercholesterolemia 10/01/2008  . Benign essential HTN 08/27/2008  . Type 2 diabetes mellitus with microalbuminuria ([place]) 08/27/2008    History reviewed. No pertinent surgical history.  Family History  Problem Relation Age of Onset  . Diabetes Mother   . Hypertension Mother   . Cancer Father     Lung  . Hypertension Sister     1 of the patient sister's  . Diabetes Sister     1 of the pateint sisters  . Hypertension Brother     2    Social History   Social History  . Marital status: Married    Spouse name: N/A  . Number of children: N/A  . Years of education: N/A   Occupational History  . Not on file.   Social History Main Topics  . Smoking status: Former Smoker    Packs/day: 0.50    Years: 3.00    Types: Cigarettes    Start  date: 04/20/1978    Quit date: 04/20/1981  . Smokeless tobacco: Never Used  . Alcohol use No  . Drug use: No  . Sexual activity: Yes    Partners: Female   Other Topics Concern  . Not on file   Social History Narrative  . No narrative on file     Current Outpatient Prescriptions:  .  amLODipine (NORVASC) 5 MG tablet, Take 1 tablet (5 mg total) by mouth daily., Disp: 30 tablet, Rfl: 3 .  aspirin (ASPIRIN ADULT LOW DOSE) 81 MG EC tablet, Take by mouth., Disp: , Rfl:  .  atorvastatin (LIPITOR) 40 MG tablet, Take 1 tablet (40 mg total) by mouth daily., Disp: 30 tablet, Rfl: 3 .  Blood Glucose Monitoring Suppl (FREESTYLE FREEDOM LITE) w/Device KIT, , Disp: , Rfl:  .  glipiZIDE (GLUCOTROL XL) 5 MG 24 hr tablet, TAKE ONE TABLET BY MOUTH ONCE DAILY WITH BREAKFAST, Disp: 30 tablet, Rfl: 3 .  lisinopril-hydrochlorothiazide (PRINZIDE,ZESTORETIC) 20-25 MG tablet, Take 1 tablet by mouth daily., Disp: 30 tablet, Rfl: 3 .  metFORMIN (GLUMETZA) 1000 MG (MOD) 24 hr tablet, Take 1 tablet (1,000 mg total) by mouth daily with breakfast., Disp: 60 tablet, Rfl: 3  Allergies  Allergen Reactions  . Shrimp [Shellfish Allergy]  Swelling     ROS  Constitutional: Negative for fever, positive for  weight change.  Respiratory: Negative for cough and shortness of breath.   Cardiovascular: Negative for chest pain or palpitations.  Gastrointestinal: Negative for abdominal pain, no bowel changes.  Musculoskeletal: Negative for gait problem or joint swelling.  Skin: Negative for rash.  Neurological: Negative for dizziness or headache.  No other specific complaints in a complete review of systems (except as listed in HPI above).  Objective  Vitals:   01/18/17 1117  BP: 122/72  Pulse: 81  Resp: 16  Temp: 98 F (36.7 C)  SpO2: 97%  Weight: 251 lb 3 oz (113.9 kg)  Height: _0  (1.854 m)    Body mass index is 33.14 kg/m.  Physical Exam  Constitutional: Patient appears well-developed and  well-nourished. Obese  No distress.  HEENT: head atraumatic, normocephalic, pupils equal and reactive to light,  neck supple, throat within normal limits Cardiovascular: Normal rate, regular rhythm and normal heart sounds.  No murmur heard. No BLE edema. Pulmonary/Chest: Effort normal and breath sounds normal. No respiratory distress. Abdominal: Soft.  There is no tenderness. Large umbilical hernia Psychiatric: Patient has a normal mood and affect. behavior is normal. Judgment and thought content normal.  Recent Results (from the past 2160 hour(s))  POCT HgB A1C     Status: Abnormal   Collection Time: 01/18/17 11:20 AM  Result Value Ref Range   Hemoglobin A1C 8.5      PHQ2/9: Depression screen [facility] 2/9 01/18/2017 07/21/2016 04/20/2016  Decreased Interest 0 0 0  Down, Depressed, Hopeless 0 0 0  PHQ - 2 Score 0 0 0     Fall Risk: Fall Risk  01/18/2017 07/21/2016 04/20/2016  Falls in the past year? No No No     Functional Status Survey: Is the patient deaf or have difficulty hearing?: No Does the patient have difficulty seeing, even when wearing glasses/contacts?: No Does the patient have difficulty concentrating, remembering, or making decisions?: No Does the patient have difficulty walking or climbing stairs?: No Does the patient have difficulty dressing or bathing?: No Does the patient have difficulty doing errands alone such as visiting a doctor's office or shopping?: No    Assessment & Plan  1. Type 2 diabetes mellitus with microalbuminuria, without long-term current use of insulin (HCC)   - POCT HgB A1C - improved, we will try Metformin ER to get glucose at goal, he states not as compliant with his diet during the holiday season - lisinopril-hydrochlorothiazide (PRINZIDE,ZESTORETIC) 20-25 MG tablet; Take 1 tablet by mouth daily.  Dispense: 30 tablet; Refill: 3 - glipiZIDE (GLUCOTROL XL) 5 MG 24 hr tablet; TAKE ONE TABLET BY MOUTH ONCE DAILY WITH BREAKFAST  Dispense: 30  tablet; Refill: 3 - metFORMIN (GLUMETZA) 1000 MG (MOD) 24 hr tablet; Take 1 tablet (1,000 mg total) by mouth daily with breakfast.  Dispense: 60 tablet; Refill: 3  2. Benign essential HTN  At goal  - lisinopril-hydrochlorothiazide (PRINZIDE,ZESTORETIC) 20-25 MG tablet; Take 1 tablet by mouth daily.  Dispense: 30 tablet; Refill: 3 - amLODipine (NORVASC) 5 MG tablet; Take 1 tablet (5 mg total) by mouth daily.  Dispense: 30 tablet; Refill: 3  3. Dyslipidemia  - atorvastatin (LIPITOR) 40 MG tablet; Take 1 tablet (40 mg total) by mouth daily.  Dispense: 30 tablet; Refill: 3

## 2017-04-22 ENCOUNTER — Other Ambulatory Visit: Payer: Self-pay

## 2017-04-22 DIAGNOSIS — E1129 Type 2 diabetes mellitus with other diabetic kidney complication: Secondary | ICD-10-CM

## 2017-04-22 DIAGNOSIS — I10 Essential (primary) hypertension: Secondary | ICD-10-CM

## 2017-04-22 DIAGNOSIS — R809 Proteinuria, unspecified: Principal | ICD-10-CM

## 2017-04-22 DIAGNOSIS — E785 Hyperlipidemia, unspecified: Secondary | ICD-10-CM

## 2017-04-22 MED ORDER — GLIPIZIDE ER 5 MG PO TB24: ORAL_TABLET | ORAL | 0 refills | Status: DC

## 2017-04-22 MED ORDER — ATORVASTATIN CALCIUM 40 MG PO TABS: 40.0000 mg | ORAL_TABLET | Freq: Every day | ORAL | 0 refills | Status: DC

## 2017-04-22 MED ORDER — LISINOPRIL-HYDROCHLOROTHIAZIDE 20-25 MG PO TABS: 1.0000 | ORAL_TABLET | Freq: Every day | ORAL | 0 refills | Status: DC

## 2017-04-22 NOTE — Telephone Encounter (Signed)
Patient requesting refill of Glipizide ER, Atorvastatin, Lisinopril-HCTZ and has an upcoming appointment on 05/19/17.

## 2017-05-19 ENCOUNTER — Ambulatory Visit: Payer: Commercial Managed Care - HMO | Admitting: Family Medicine

## 2017-07-01 ENCOUNTER — Other Ambulatory Visit: Payer: Self-pay | Admitting: Family Medicine

## 2017-07-01 DIAGNOSIS — E1129 Type 2 diabetes mellitus with other diabetic kidney complication: Secondary | ICD-10-CM

## 2017-07-01 DIAGNOSIS — R809 Proteinuria, unspecified: Principal | ICD-10-CM

## 2017-07-05 NOTE — Telephone Encounter (Signed)
Left voice message for pt to return call to schedule appt. Also informed that script has been sent to pharmacy.

## 2017-08-29 ENCOUNTER — Other Ambulatory Visit: Payer: Self-pay

## 2017-08-29 DIAGNOSIS — R809 Proteinuria, unspecified: Secondary | ICD-10-CM

## 2017-08-29 DIAGNOSIS — E1129 Type 2 diabetes mellitus with other diabetic kidney complication: Secondary | ICD-10-CM

## 2017-08-29 DIAGNOSIS — I10 Essential (primary) hypertension: Secondary | ICD-10-CM

## 2017-08-29 DIAGNOSIS — E785 Hyperlipidemia, unspecified: Secondary | ICD-10-CM

## 2017-08-29 NOTE — Telephone Encounter (Signed)
Patient requesting refill of Atorvastatin, Glipizide, Lisinopril-HCTZ, and Metformin with 90 day supplies to [place].

## 2017-08-29 NOTE — Telephone Encounter (Signed)
He has not been seen since Feb, next time, please offer patient an appointment and explain that he needs to be seen, he can be seen on 10/09 Thank you

## 2017-09-26 ENCOUNTER — Ambulatory Visit: Payer: Commercial Managed Care - HMO | Admitting: Family Medicine

## 2017-10-04 ENCOUNTER — Ambulatory Visit: Payer: Commercial Managed Care - HMO | Admitting: Family Medicine

## 2017-10-30 IMAGING — US US ABDOMINAL AORTA SCREENING AAA
1 series · 14 of 25 positions shown · non-contrast
Comparison: None.

CLINICAL DATA: Medicare screening for abdominal aortic aneurysm.

EXAM:
ULTRASOUND OF ABDOMINAL AORTA
TECHNIQUE: Ultrasound examination of the abdominal aorta was performed to
evaluate for abdominal aortic aneurysm.

[Series 1: us abdominal aorta screening aaa · 0.34mm/px · 14 of 27 slices shown]
[im 1/27]
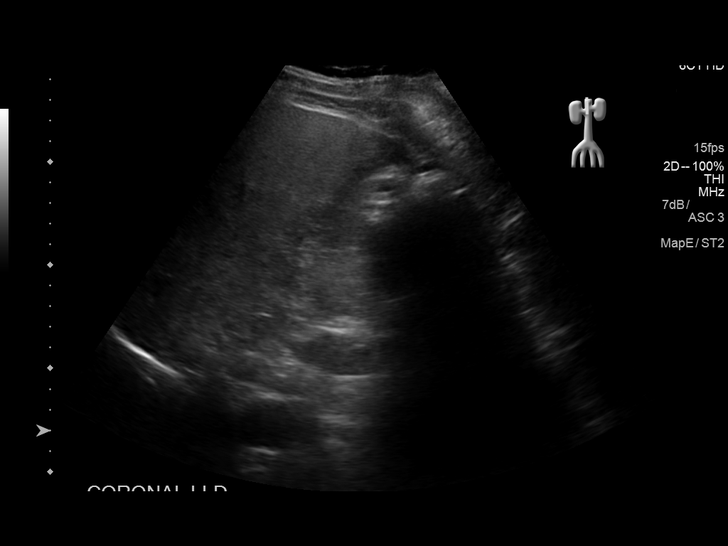
[im 3/27]
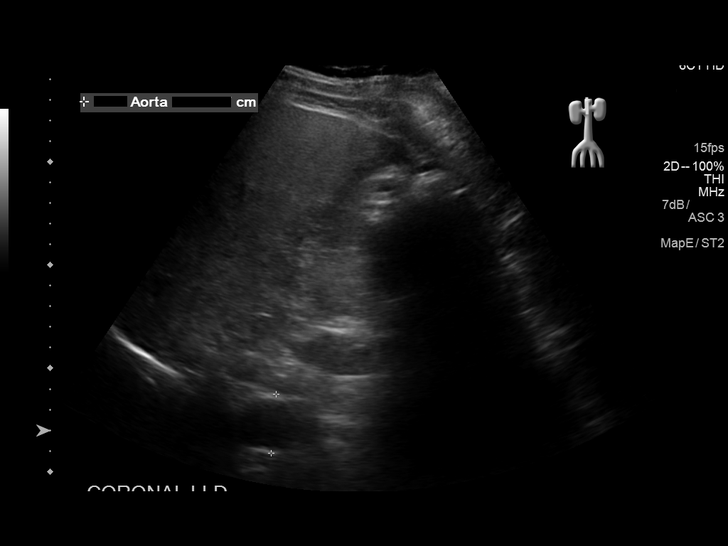
[im 5/27]
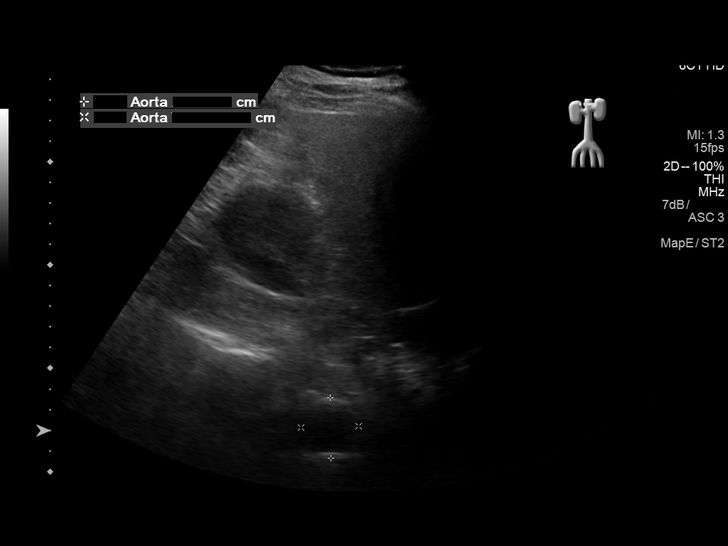
[im 7/27]
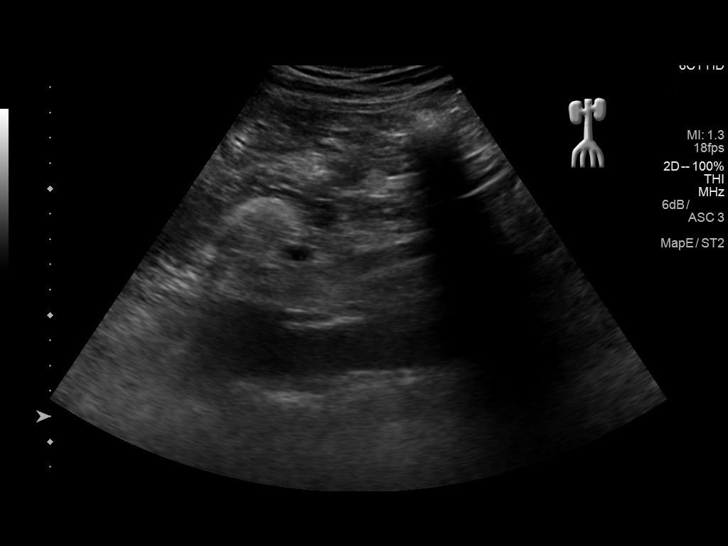
[im 9/27]
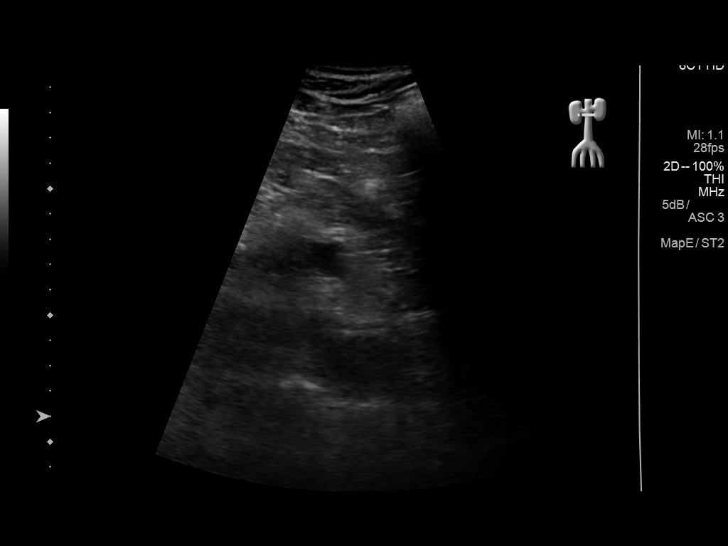
[im 10/27]
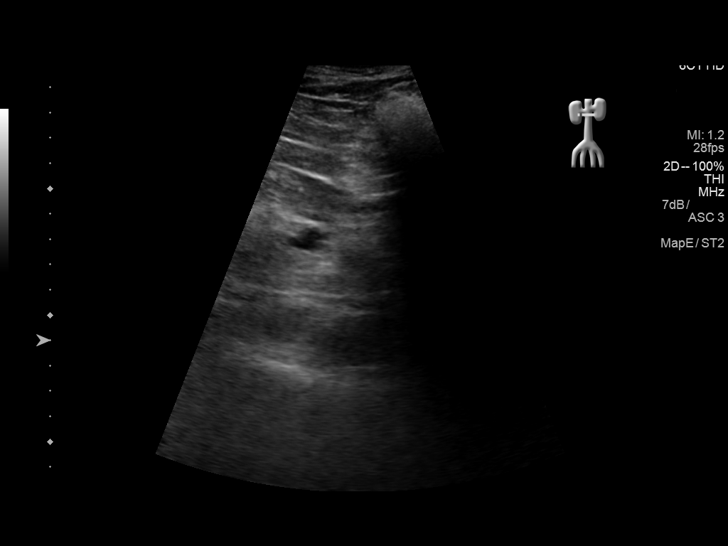
[im 12/27]
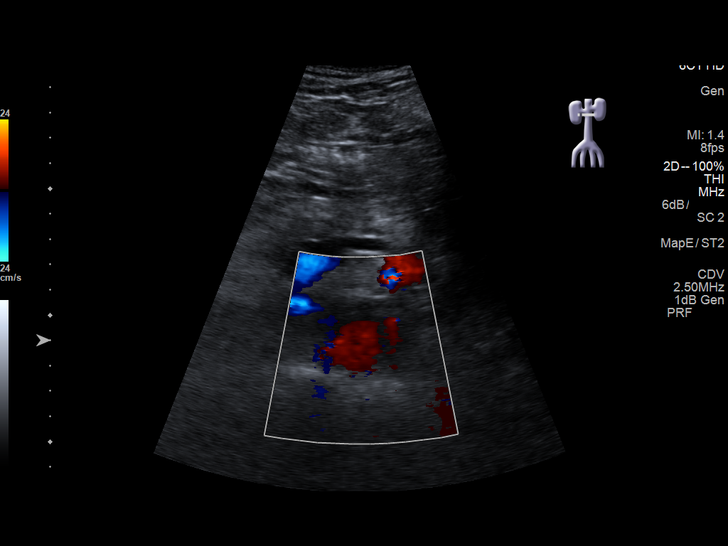
[im 15/27]
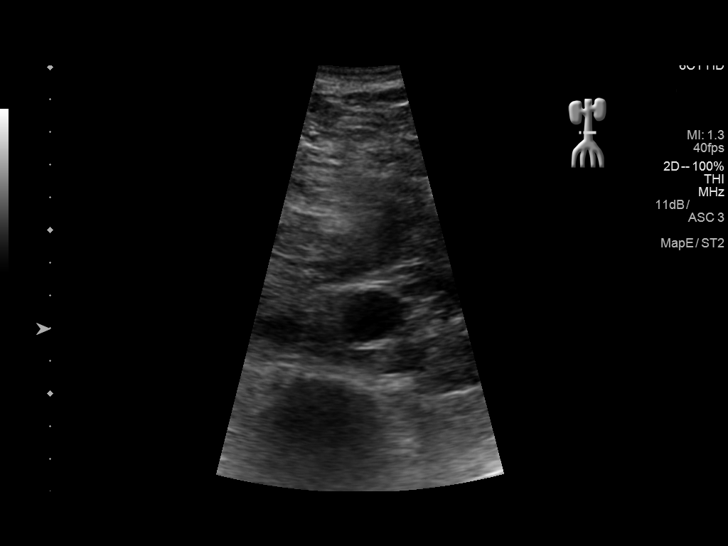
[im 17/27]
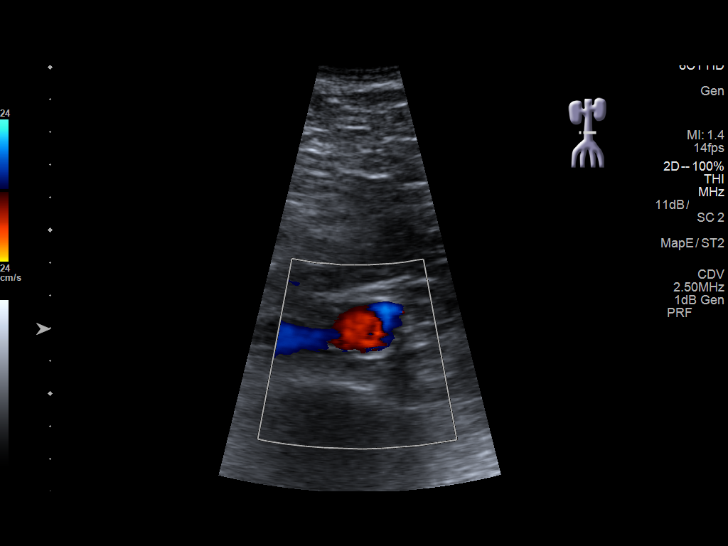
[im 18/27]
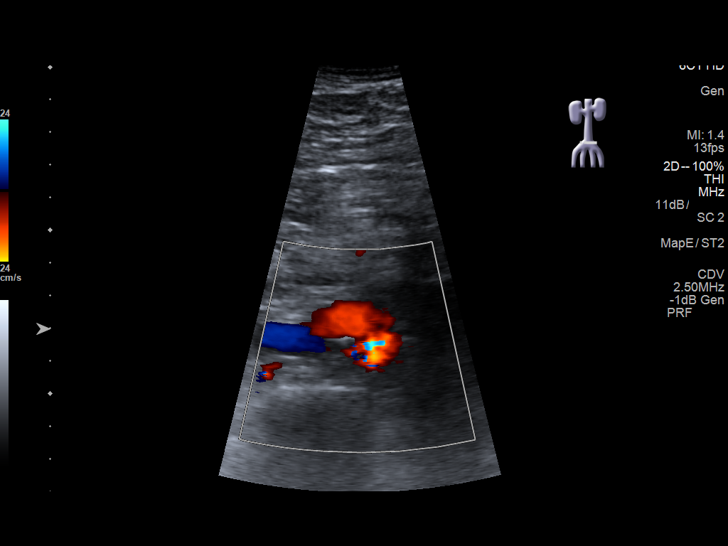
[im 20/27]
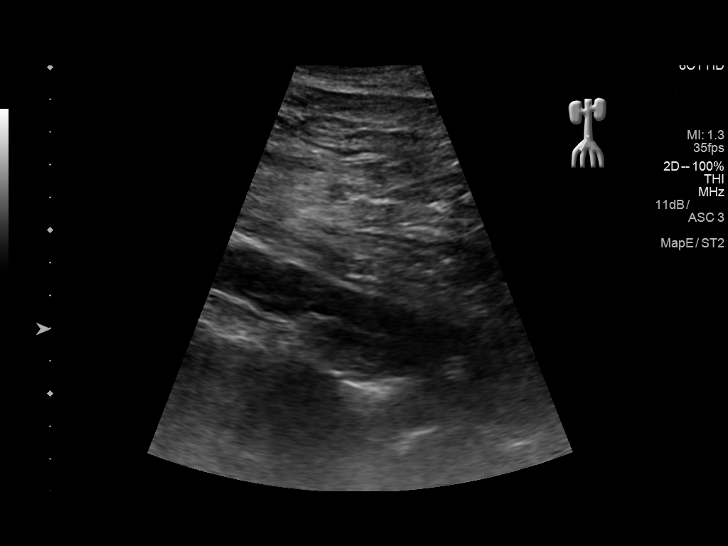
[im 22/27]
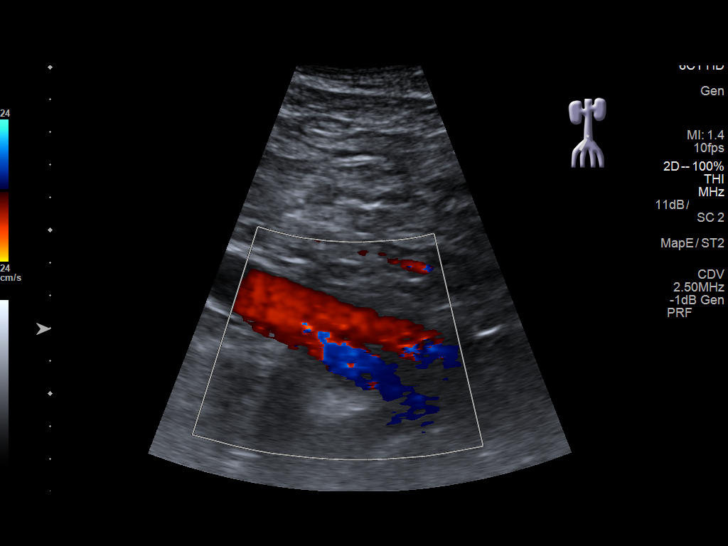
[im 24/27]
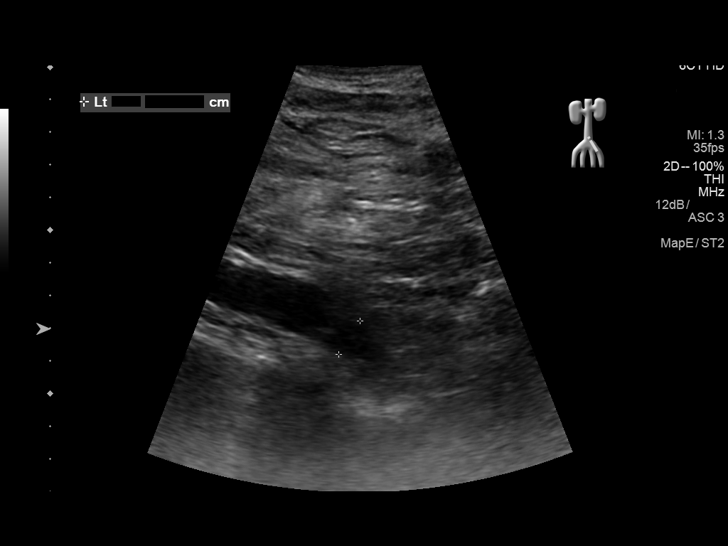
[im 27/27]
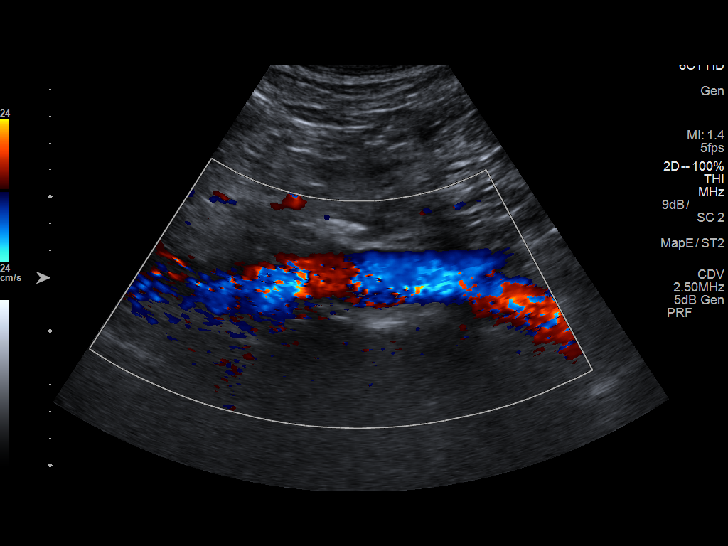

[14 of 25 positions shown; findings below may reference images not displayed]

FINDINGS: Abdominal Aorta

No aneurysm identified.

Maximum Diameter: 2.9 cm proximally. 2.0 cm distally. 2.4 cm in
midportion.
IMPRESSION: No evidence of abdominal aortic aneurysm.

## 2017-12-28 ENCOUNTER — Other Ambulatory Visit: Payer: Self-pay | Admitting: Family Medicine

## 2017-12-28 DIAGNOSIS — E785 Hyperlipidemia, unspecified: Secondary | ICD-10-CM

## 2017-12-28 DIAGNOSIS — I10 Essential (primary) hypertension: Secondary | ICD-10-CM

## 2017-12-28 DIAGNOSIS — R809 Proteinuria, unspecified: Principal | ICD-10-CM

## 2017-12-28 DIAGNOSIS — E1129 Type 2 diabetes mellitus with other diabetic kidney complication: Secondary | ICD-10-CM

## 2017-12-28 NOTE — Telephone Encounter (Signed)
Refill request for Hypertension medication:  Amlodipine 5 mg and Lisinopril-HCTZ 20-25 mg  Last office visit pertaining to hypertension: 01/18/2017  BP Readings from Last 3 Encounters:  01/18/17 122/72  07/21/16 122/72  04/21/16 (!) 142/76     Lab Results  Component Value Date   CREATININE 1.44 (H) 04/20/2016   BUN 17 04/20/2016   NA 138 04/20/2016   K 4.4 04/20/2016   CL 99 04/20/2016   CO2 22 04/20/2016   Follow-up on file. None indicated   Refill Request for Cholesterol medication. Atorvastatin 40 mg  Last physical: 04/21/2016  Lab Results  Component Value Date   CHOL 230 (H) 04/20/2016   HDL 54 04/20/2016   LDLCALC 154 (H) 04/20/2016   TRIG 109 04/20/2016   CHOLHDL 4.3 04/20/2016   Refill request for diabetic medication:   Glipizide 5 mg  Last office visit pertaining to diabetes: 01/18/2017  Lab Results  Component Value Date   HGBA1C 8.5 01/18/2017   Follow-up on file. None indicated   Patient has been notified that he needs a follow up appointment for further refills.

## 2017-12-30 NOTE — Telephone Encounter (Signed)
Please call and schedule appointment for further refills.

## 2018-01-02 NOTE — Telephone Encounter (Signed)
Tried calling pt to schedule an appt. Memory full, could not leave messag

## 2018-05-03 ENCOUNTER — Emergency Department
Admission: EM | Admit: 2018-05-03 | Discharge: 2018-05-03 | Disposition: A | Discharge status: Home / Self Care | Payer: No Typology Code available for payment source | Attending: Emergency Medicine | Admitting: Emergency Medicine

## 2018-05-03 ENCOUNTER — Encounter: Payer: Self-pay | Admitting: Emergency Medicine

## 2018-05-03 ENCOUNTER — Emergency Department: Payer: No Typology Code available for payment source

## 2018-05-03 DIAGNOSIS — Z79899 Other long term (current) drug therapy: Secondary | ICD-10-CM | POA: Insufficient documentation

## 2018-05-03 DIAGNOSIS — Z7982 Long term (current) use of aspirin: Secondary | ICD-10-CM | POA: Diagnosis not present

## 2018-05-03 DIAGNOSIS — Z87891 Personal history of nicotine dependence: Secondary | ICD-10-CM | POA: Diagnosis not present

## 2018-05-03 DIAGNOSIS — I10 Essential (primary) hypertension: Secondary | ICD-10-CM | POA: Insufficient documentation

## 2018-05-03 DIAGNOSIS — Z7984 Long term (current) use of oral hypoglycemic drugs: Secondary | ICD-10-CM | POA: Insufficient documentation

## 2018-05-03 DIAGNOSIS — M542 Cervicalgia: Secondary | ICD-10-CM | POA: Insufficient documentation

## 2018-05-03 DIAGNOSIS — E1129 Type 2 diabetes mellitus with other diabetic kidney complication: Secondary | ICD-10-CM | POA: Diagnosis not present

## 2018-05-03 MED ORDER — CYCLOBENZAPRINE HCL 10 MG PO TABS
10.0000 mg | ORAL_TABLET | Freq: Once | ORAL | Status: AC
  Administered 2018-05-03: 10 mg via ORAL
  Filled 2018-05-03: qty 1

## 2018-05-03 MED ORDER — CYCLOBENZAPRINE HCL 10 MG PO TABS: 10.0000 mg | ORAL_TABLET | Freq: Three times a day (TID) | ORAL | 0 refills | Status: AC | PRN
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
## 2018-05-03 NOTE — ED Notes (Signed)

## 2018-06-08 ENCOUNTER — Ambulatory Visit: Payer: Commercial Managed Care - HMO | Admitting: Family Medicine

## 2018-06-08 ENCOUNTER — Ambulatory Visit (INDEPENDENT_AMBULATORY_CARE_PROVIDER_SITE_OTHER): Payer: Medicare Other

## 2018-06-08 ENCOUNTER — Encounter: Payer: Self-pay | Admitting: Family Medicine

## 2018-06-08 VITALS — BP 172/98 | HR 79 | Temp 97.9°F | Resp 12 | Ht 73.0 in | Wt 238.5 lb

## 2018-06-08 DIAGNOSIS — IMO0002 Reserved for concepts with insufficient information to code with codable children: Secondary | ICD-10-CM

## 2018-06-08 DIAGNOSIS — N181 Chronic kidney disease, stage 1: Secondary | ICD-10-CM | POA: Diagnosis not present

## 2018-06-08 DIAGNOSIS — E1165 Type 2 diabetes mellitus with hyperglycemia: Secondary | ICD-10-CM | POA: Diagnosis not present

## 2018-06-08 DIAGNOSIS — Z9119 Patient's noncompliance with other medical treatment and regimen: Secondary | ICD-10-CM

## 2018-06-08 DIAGNOSIS — Z0000 Encounter for general adult medical examination without abnormal findings: Secondary | ICD-10-CM

## 2018-06-08 DIAGNOSIS — R809 Proteinuria, unspecified: Secondary | ICD-10-CM

## 2018-06-08 DIAGNOSIS — E1169 Type 2 diabetes mellitus with other specified complication: Secondary | ICD-10-CM | POA: Diagnosis not present

## 2018-06-08 DIAGNOSIS — E785 Hyperlipidemia, unspecified: Secondary | ICD-10-CM | POA: Diagnosis not present

## 2018-06-08 DIAGNOSIS — I10 Essential (primary) hypertension: Secondary | ICD-10-CM

## 2018-06-08 DIAGNOSIS — E1129 Type 2 diabetes mellitus with other diabetic kidney complication: Secondary | ICD-10-CM

## 2018-06-08 DIAGNOSIS — Z91199 Patient's noncompliance with other medical treatment and regimen due to unspecified reason: Secondary | ICD-10-CM

## 2018-06-08 DIAGNOSIS — Z1211 Encounter for screening for malignant neoplasm of colon: Secondary | ICD-10-CM | POA: Diagnosis not present

## 2018-06-08 LAB — CBC WITH DIFFERENTIAL/PLATELET
BASOS PCT: 0.6 %
Basophils Absolute: 37 cells/uL (ref 0–200)
EOS PCT: 2.4 %
Eosinophils Absolute: 149 cells/uL (ref 15–500)
HEMATOCRIT: 44.2 % (ref 38.5–50.0)
HEMOGLOBIN: 14 g/dL (ref 13.2–17.1)
LYMPHS ABS: 1476 {cells}/uL (ref 850–3900)
MCH: 27.6 pg (ref 27.0–33.0)
MCHC: 31.7 g/dL — ABNORMAL LOW (ref 32.0–36.0)
MCV: 87 fL (ref 80.0–100.0)
MPV: 10.6 fL (ref 7.5–12.5)
Monocytes Relative: 5.7 %
NEUTROS ABS: 4185 {cells}/uL (ref 1500–7800)
NEUTROS PCT: 67.5 %
Platelets: 210 10*3/uL (ref 140–400)
RBC: 5.08 10*6/uL (ref 4.20–5.80)
RDW: 12.8 % (ref 11.0–15.0)
Total Lymphocyte: 23.8 %
WBC: 6.2 10*3/uL (ref 3.8–10.8)
WBCMIX: 353 {cells}/uL (ref 200–950)

## 2018-06-08 LAB — LIPID PANEL
Cholesterol: 247 mg/dL — ABNORMAL HIGH (ref <200)
HDL: 63 mg/dL (ref >40)
LDL Cholesterol (Calc): 163 mg/dL (calc) — ABNORMAL HIGH
NON-HDL CHOLESTEROL (CALC): 184 mg/dL — AB (ref <130)
Total CHOL/HDL Ratio: 3.9 (calc) (ref <5.0)
Triglycerides: 100 mg/dL (ref <150)

## 2018-06-08 LAB — COMPLETE METABOLIC PANEL WITH GFR
AG RATIO: 1.5 (calc) (ref 1.0–2.5)
ALBUMIN MSPROF: 4.1 g/dL (ref 3.6–5.1)
ALT: 15 U/L (ref 9–46)
AST: 13 U/L (ref 10–35)
Alkaline phosphatase (APISO): 105 U/L (ref 40–115)
BUN: 12 mg/dL (ref 7–25)
CALCIUM: 9.3 mg/dL (ref 8.6–10.3)
CO2: 25 mmol/L (ref 20–32)
CREATININE: 1.19 mg/dL (ref 0.70–1.25)
Chloride: 102 mmol/L (ref 98–110)
GFR, EST AFRICAN AMERICAN: 73 mL/min/{1.73_m2} (ref >=60)
GFR, EST NON AFRICAN AMERICAN: 63 mL/min/{1.73_m2} (ref >=60)
GLOBULIN: 2.8 g/dL (ref 1.9–3.7)
Glucose, Bld: 349 mg/dL — ABNORMAL HIGH (ref 65–99)
POTASSIUM: 4.1 mmol/L (ref 3.5–5.3)
SODIUM: 137 mmol/L (ref 135–146)
TOTAL PROTEIN: 6.9 g/dL (ref 6.1–8.1)
Total Bilirubin: 0.7 mg/dL (ref 0.2–1.2)

## 2018-06-08 LAB — POCT GLYCOSYLATED HEMOGLOBIN (HGB A1C): HbA1c POC (<> result, manual entry): >14 % (ref 4.0–5.6)

## 2018-06-08 MED ORDER — LISINOPRIL-HYDROCHLOROTHIAZIDE 20-25 MG PO TABS: 1.0000 | ORAL_TABLET | Freq: Every day | ORAL | 0 refills | Status: DC

## 2018-06-08 MED ORDER — METFORMIN HCL 1000 MG PO TABS: 1000.0000 mg | ORAL_TABLET | Freq: Two times a day (BID) | ORAL | 0 refills | Status: DC

## 2018-06-08 MED ORDER — INSULIN DEGLUDEC 100 UNIT/ML ~~LOC~~ SOPN: 10.0000 [IU] | PEN_INJECTOR | Freq: Every day | SUBCUTANEOUS | 0 refills | Status: DC

## 2018-06-08 MED ORDER — AMLODIPINE BESYLATE 5 MG PO TABS: 5.0000 mg | ORAL_TABLET | Freq: Every day | ORAL | 0 refills | Status: DC

## 2018-06-08 MED ORDER — ATORVASTATIN CALCIUM 40 MG PO TABS: 40.0000 mg | ORAL_TABLET | Freq: Every day | ORAL | 0 refills | Status: DC

## 2018-06-08 MED ORDER — GLIPIZIDE ER 5 MG PO TB24: ORAL_TABLET | ORAL | 0 refills | Status: DC

## 2018-06-08 NOTE — Progress Notes (Signed)
Name: Austin Ortega   MRN: [id number]    DOB: 1951/01/30   Date:06/08/2018       Progress Note  Subjective  Chief Complaint  Chief Complaint  Patient presents with  . Diabetes  . Hyperlipidemia  . Hypertension    HPI  DMII : hgbA1C has gone down from 14.5 % in May to 8.5%Feb and has been out of medication and is up to 14 again. He  also had 100 of microalbumin.He  states he is feeling well, he still has nocturia about twice - three times  per night, episodes of  polyphagia or polydipsia, not checking glucose at home lately. No hypoglycemic episodes. He has lost weight since last visit.   HTN: he has been out of medication and bp is out of control, no chest pain or palpitation   Hyperlipidemia: he had a gap in insurance and is out of medication   Morbid obesity: losing weight, likely from uncontrolled DM   Patient Active Problem List   Diagnosis Date Noted  . Non compliance with medical treatment 06/08/2018  . Umbilical hernia without obstruction and without gangrene 04/21/2016  . Vitiligo 04/21/2016  . Morbid obesity ([place]) 04/20/2016  . Decreased libido 03/04/2009  . Pure hypercholesterolemia 10/01/2008  . Benign essential HTN 08/27/2008  . Type 2 diabetes mellitus with microalbuminuria ([place]) 08/27/2008    History reviewed. No pertinent surgical history.  Family History  Problem Relation Age of Onset  . Diabetes Mother   . Hypertension Mother   . Cancer Father        Lung  . Hypertension Sister   . Diabetes Sister   . Hypertension Brother   . Hypertension Brother   . Heart attack Brother     Social History   Socioeconomic History  . Marital status: Married    Spouse name: Vaughan Basta  . Number of children: 4  . Years of education: some college  . Highest education level: 12th grade  Occupational History  . 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 Financial resource strain: Not hard at all  . Food insecurity:    Worry: Never  true    Inability: Never true  . Transportation needs:    Medical: No    Non-medical: No  Tobacco Use  . Smoking status: Former Smoker    Packs/day: 0.50    Years: 3.00    Pack years: 1.50    Types: Cigarettes    Start date: 04/20/1978    Last attempt to quit: 04/20/1981    Years since quitting: 37.1  . Smokeless tobacco: Never Used  . Tobacco comment: smoking cessation materials not required  Substance and Sexual Activity  . Alcohol use: No    Alcohol/week: 0.0 oz  . Drug use: No  . Sexual activity: Yes    Partners: Female  Lifestyle  . Physical activity:    Days per week: 0 days    Minutes per session: 0 min  . Stress: Not at all  Relationships  . Social connections:    Talks on phone: Patient refused    Gets together: Patient refused    Attends religious service: Patient refused    Active member of club or organization: Patient refused    Attends meetings of clubs or organizations: Patient refused    Relationship status: Married  . Intimate partner violence:    Fear of current or ex partner: No    Emotionally abused: No  Physically abused: No    Forced sexual activity: No  Other Topics Concern  . Not on file  Social History Narrative  . Not on file     Current Outpatient Medications:  .  amLODipine (NORVASC) 5 MG tablet, Take 1 tablet (5 mg total) by mouth daily., Disp: 30 tablet, Rfl: 0 .  aspirin (ASPIRIN ADULT LOW DOSE) 81 MG EC tablet, Take by mouth., Disp: , Rfl:  .  atorvastatin (LIPITOR) 40 MG tablet, Take 1 tablet (40 mg total) by mouth daily., Disp: 30 tablet, Rfl: 0 .  Blood Glucose Monitoring Suppl (FREESTYLE FREEDOM LITE) w/Device KIT, , Disp: , Rfl:  .  glipiZIDE (GLUCOTROL XL) 5 MG 24 hr tablet, TAKE ONE TABLET BY MOUTH ONCE DAILY WITH BREAKFAST, Disp: 30 tablet, Rfl: 0 .  insulin degludec (TRESIBA FLEXTOUCH) 100 UNIT/ML SOPN FlexTouch Pen, Inject 0.1 mLs (10 Units total) into the skin daily., Disp: 1 pen, Rfl: 0 .  lisinopril-hydrochlorothiazide  (PRINZIDE,ZESTORETIC) 20-25 MG tablet, Take 1 tablet by mouth daily., Disp: 30 tablet, Rfl: 0 .  metFORMIN (GLUCOPHAGE) 1000 MG tablet, Take 1 tablet (1,000 mg total) by mouth 2 (two) times daily with a meal., Disp: 60 tablet, Rfl: 0  Allergies  Allergen Reactions  . Shrimp [Shellfish Allergy] Swelling     ROS  Constitutional: Negative for fever, positive for  weight change.  Respiratory: Negative for cough and shortness of breath.   Cardiovascular: Negative for chest pain or palpitations.  Gastrointestinal: Negative for abdominal pain, no bowel changes.  Musculoskeletal: Negative for gait problem or joint swelling.  Skin: Negative for rash.  Neurological: Negative for dizziness or headache.  No other specific complaints in a complete review of systems (except as listed in HPI above).  Objective  Vitals:   06/08/18 0918  BP: (!) 172/98  Pulse: 79  Resp: 12  Temp: 97.9 F (36.6 C)  TempSrc: Oral  SpO2: 96%  Weight: 238 lb 8 oz (108.2 kg)  Height: '6\' 1"'  (1.854 m)    Body mass index is 31.47 kg/m.  Physical Exam  Constitutional: Patient appears well-developed and well-nourished. Obese.  No distress.  HEENT: head atraumatic, normocephalic, pupils equal and reactive to light, neck supple, throat within normal limits Cardiovascular: Normal rate, regular rhythm and normal heart sounds.  No murmur heard. No BLE edema. Pulmonary/Chest: Effort normal and breath sounds normal. No respiratory distress. Abdominal: Soft.  There is no tenderness. Psychiatric: Patient has a normal mood and affect. behavior is normal. Judgment and thought content normal.  Recent Results (from the past 2160 hour(s))  POCT HgB A1C     Status: None   Collection Time: 06/08/18  9:37 AM  Result Value Ref Range   Hemoglobin A1C  4.0 - 5.6 %   HbA1c POC (<> result, manual entry) >14.0 4.0 - 5.6 %   HbA1c, POC (prediabetic range)  5.7 - 6.4 %   HbA1c, POC (controlled diabetic range)  0.0 - 7.0 %     Diabetic Foot Exam: Diabetic Foot Exam - Simple   Simple Foot Form Visual Inspection No deformities, no ulcerations, no other skin breakdown bilaterally:  Yes Sensation Testing Intact to touch and monofilament testing bilaterally:  Yes Pulse Check Posterior Tibialis and Dorsalis pulse intact bilaterally:  Yes Comments      PHQ2/9: Depression screen [facility] 2/9 06/08/2018 01/18/2017 07/21/2016 04/20/2016  Decreased Interest 0 0 0 0  Down, Depressed, Hopeless 0 0 0 0  PHQ - 2 Score 0 0 0 0  Altered sleeping 0 - - -  Tired, decreased energy 0 - - -  Change in appetite 0 - - -  Feeling bad or failure about yourself  0 - - -  Trouble concentrating 0 - - -  Moving slowly or fidgety/restless 0 - - -  Suicidal thoughts 0 - - -  PHQ-9 Score 0 - - -  Difficult doing work/chores Not difficult at all - - -     Fall Risk: Fall Risk  06/08/2018 01/18/2017 07/21/2016 04/20/2016  Falls in the past year? No No No No  Risk for fall due to : Impaired vision - - -  Risk for fall due to: Comment wears eyeglasses - - -     Assessment & Plan  1. Uncontrolled type 2 diabetes mellitus with hyperglycemia (HCC)  - POCT HgB A1C - Urine Microalbumin w/creat. ratio - insulin degludec (TRESIBA FLEXTOUCH) 100 UNIT/ML SOPN FlexTouch Pen; Inject 0.1 mLs (10 Units total) into the skin daily.  Dispense: 1 pen; Refill: 0  2. Benign essential HTN  - CBC with Differential/Platelet - COMPLETE METABOLIC PANEL WITH GFR - amLODipine (NORVASC) 5 MG tablet; Take 1 tablet (5 mg total) by mouth daily.  Dispense: 30 tablet; Refill: 0 - lisinopril-hydrochlorothiazide (PRINZIDE,ZESTORETIC) 20-25 MG tablet; Take 1 tablet by mouth daily.  Dispense: 30 tablet; Refill: 0  3. Dyslipidemia associated with type 2 diabetes mellitus (HCC)  - Lipid panel  4. Chronic kidney disease, stage I  - VITAMIN D 25 Hydroxy (Vit-D Deficiency, Fractures) - Urine Microalbumin w/creat. ratio  5. Uncontrolled type 2 diabetes mellitus  with microalbuminuria (HCC)  We will add insulin today   6. Dyslipidemia  - atorvastatin (LIPITOR) 40 MG tablet; Take 1 tablet (40 mg total) by mouth daily.  Dispense: 30 tablet; Refill: 0  7. Type 2 diabetes mellitus with microalbuminuria, without long-term current use of insulin (HCC)  - glipiZIDE (GLUCOTROL XL) 5 MG 24 hr tablet; TAKE ONE TABLET BY MOUTH ONCE DAILY WITH BREAKFAST  Dispense: 30 tablet; Refill: 0 - lisinopril-hydrochlorothiazide (PRINZIDE,ZESTORETIC) 20-25 MG tablet; Take 1 tablet by mouth daily.  Dispense: 30 tablet; Refill: 0 - metFORMIN (GLUCOPHAGE) 1000 MG tablet; Take 1 tablet (1,000 mg total) by mouth 2 (two) times daily with a meal.  Dispense: 60 tablet; Refill: 0  8. Non compliance with medical treatment   9. Morbid obesity ([place])  Based on BMI and co-morbidities, he lost weight but likely from uncontrolled DM, explained he needs to follow a healthy diet

## 2018-06-08 NOTE — Progress Notes (Addendum)
Subjective:   Austin Ortega is a 67 y.o. male who presents for an Initial Medicare Annual Wellness Visit.  Review of Systems  N/A Cardiac Risk Factors include: advanced age (>5mn, >>68women);male gender;dyslipidemia;diabetes mellitus;hypertension;obesity (BMI >30kg/m2);sedentary lifestyle    Objective:    Today's Vitals   06/08/18 0842  BP: (!) 172/98  Pulse: 79  Resp: 12  Temp: 97.9 F (36.6 C)  TempSrc: Oral  SpO2: 96%  Weight: 238 lb 8 oz (108.2 kg)  Height: '6\' 1"'  (1.854 m)   Body mass index is 31.47 kg/m.  Discussed above vitals with Dr. SAncil Boozer Advised pt had informed me that he had experienced a lapse in coverage and was unable to fill medications. Presents today for refill of medications. Dr. SAncil Boozeraware that pt has been advised to call our office if he experiences any further issues with financial strains re: affording medications. Advised we will begin process for financial assistance if needed. Pt verbalized acceptance and understanding. Dr. SAncil Boozerto further address with pt today.  Advanced Directives 06/08/2018 01/18/2017 07/21/2016 04/20/2016 06/27/2015 06/27/2015  Does Patient Have a Medical Advance Directive? No Yes No No No No  Type of Advance Directive - [place] Would patient like information on creating a medical advance directive? Yes (MAU/Ambulatory/Procedural Areas - Information given) - No - patient declined information No - patient declined information No - patient declined information -    Current Medications (verified) Outpatient Encounter Medications as of 06/08/2018  Medication Sig  . amLODipine (NORVASC) 5 MG tablet Take 1 tablet (5 mg total) by mouth daily.  .Marland Kitchenaspirin (ASPIRIN ADULT LOW DOSE) 81 MG EC tablet Take by mouth.  .Marland Kitchenatorvastatin (LIPITOR) 40 MG tablet Take 1 tablet (40 mg total) by mouth daily.  . Blood Glucose Monitoring Suppl (FREESTYLE FREEDOM LITE) w/Device KIT   . glipiZIDE (GLUCOTROL XL) 5 MG 24 hr tablet  TAKE ONE TABLET BY MOUTH ONCE DAILY WITH BREAKFAST  . lisinopril-hydrochlorothiazide (PRINZIDE,ZESTORETIC) 20-25 MG tablet Take 1 tablet by mouth daily.  . metFORMIN (GLUCOPHAGE) 1000 MG tablet TAKE ONE TABLET BY MOUTH TWICE DAILY WITH  A  MEAL   No facility-administered encounter medications on file as of 06/08/2018.     Allergies (verified) Shrimp [shellfish allergy]   History: Past Medical History:  Diagnosis Date  . Decreased libido   . Diabetes mellitus without complication ([place]   . Hyperlipidemia   . Hypertension    History reviewed. No pertinent surgical history. Family History  Problem Relation Age of Onset  . Diabetes Mother   . Hypertension Mother   . Cancer Father        Lung  . Hypertension Sister   . Diabetes Sister   . Hypertension Brother   . Hypertension Brother   . Heart attack Brother    Social History   Socioeconomic History  . Marital status: Married    Spouse name: LVaughan Basta . Number of children: 4  . Years of education: some college  . Highest education level: 12th grade  Occupational History  . Occupation: Retired    EFish farm manager SGarment/textile technologist Social Needs  . Financial resource strain: Not hard at all  . Food insecurity:    Worry: Never true    Inability: Never true  . Transportation needs:    Medical: No    Non-medical: No  Tobacco Use  . Smoking status: Former Smoker    Packs/day: 0.50    Years:  3.00    Pack years: 1.50    Types: Cigarettes    Start date: 04/20/1978    Last attempt to quit: 04/20/1981    Years since quitting: 37.1  . Smokeless tobacco: Never Used  . Tobacco comment: smoking cessation materials not required  Substance and Sexual Activity  . Alcohol use: No    Alcohol/week: 0.0 oz  . Drug use: No  . Sexual activity: Yes    Partners: Female  Lifestyle  . Physical activity:    Days per week: 0 days    Minutes per session: 0 min  . Stress: Not at all  Relationships  . Social connections:    Talks on phone:  Patient refused    Gets together: Patient refused    Attends religious service: Patient refused    Active member of club or organization: Patient refused    Attends meetings of clubs or organizations: Patient refused    Relationship status: Married  Other Topics Concern  . Not on file  Social History Narrative  . Not on file   Tobacco Counseling Counseling given: No Comment: smoking cessation materials not required  Clinical Intake:  Pre-visit preparation completed: Yes  Pain : No/denies pain   BMI - recorded: 31.47 Nutritional Status: BMI > 30  Obese Nutritional Risks: None  Nutrition Risk Assessment: Has the patient had any N/V/D within the last 2 months?  No Does the patient have any non-healing wounds?  No Has the patient had any unintentional weight loss or weight gain?  No  Is the patient diabetic?  Yes If diabetic, was a CBG obtained today?  No Did the patient bring in their glucometer from home?  No Comments: Pt monitors CBG's daily. Denies any financial strains with the device or supplies.  Diabetic Exams: Diabetic Eye Exam: Overdue for diabetic eye exam. Pt has been advised about the importance in completing this exam. A referral has been placed today. Message sent to referral coordinator for scheduling purposes. Aware he will receive a call from our office re: appt. Diabetic Foot Exam: Overdue for diabetic foot exam. Last completed 04/20/16. Pt has been advised about the importance in completing this exam. Pt is scheduled for completion of diabetic foot exam today.  How often do you need to have someone help you when you read instructions, pamphlets, or other written materials from your doctor or pharmacy?: 1 - Never  Interpreter Needed?: No  Information entered by :: AEversole, LPN  Activities of Daily Living In your present state of health, do you have any difficulty performing the following activities: 06/08/2018  Hearing? N  Comment denies hearing aids    Vision? N  Comment wears eyeglasses  Difficulty concentrating or making decisions? N  Walking or climbing stairs? N  Dressing or bathing? N  Doing errands, shopping? N  Preparing Food and eating ? N  Comment denies dentures  Using the Toilet? N  In the past six months, have you accidently leaked urine? N  Do you have problems with loss of bowel control? N  Managing your Medications? N  Managing your Finances? N  Housekeeping or managing your Housekeeping? N  Some recent data might be hidden     Immunizations and Health Maintenance Immunization History  Administered Date(s) Administered  . Influenza, High Dose Seasonal PF 07/21/2016  . Influenza,inj,Quad PF,6+ Mos 12/25/2013, 10/01/2014  . Influenza-Unspecified 10/01/2014  . Pneumococcal Conjugate-13 10/01/2014  . Pneumococcal Polysaccharide-23 06/23/2010, 07/21/2016  . Pneumococcal-Unspecified 06/23/2010  . Tdap 07/04/2013  Health Maintenance Due  Topic Date Due  . OPHTHALMOLOGY EXAM  11/12/1960  . COLONOSCOPY  11/12/2000  . FOOT EXAM  04/20/2017  . HEMOGLOBIN A1C  07/18/2017    Patient Care Team: Steele Sizer, MD as PCP - General (Family Medicine)  Indicate any recent Medical Services you may have received from other than [facility] providers in the past year (date may be approximate).    Assessment:   This is a routine wellness examination for [employer].  Hearing/Vision screen Vision Screening Comments: Not established for annual eye exams. Referral placed today for pt to [facility] care with [facility]  Dietary issues and exercise activities discussed: Current Exercise Habits: The patient does not participate in regular exercise at present, Exercise limited by: None identified  Goals    . DIET - INCREASE WATER INTAKE     Recommend to drink at least 6-8 8oz glasses of water per day.      Depression Screen PHQ 2/9 Scores 06/08/2018 01/18/2017 07/21/2016 04/20/2016  PHQ - 2 Score 0 0 0 0  PHQ- 9 Score 0 - -  -    Fall Risk Fall Risk  06/08/2018 01/18/2017 07/21/2016 04/20/2016  Falls in the past year? No No No No  Risk for fall due to : Impaired vision - - -  Risk for fall due to: Comment wears eyeglasses - - -    FALL RISK PREVENTION PERTAINING TO HOME: Is your home free of loose throw rugs in walkways, pet beds, electrical cords, etc? Yes Is there adequate lighting in your home to reduce risk of falls?  Yes Are there stairs in or around your home WITH handrails? Yes  ASSISTIVE DEVICES UTILIZED TO PREVENT FALLS: Use of a cane, walker or w/c? No Grab bars in the bathroom? No  Shower chair or a place to sit while bathing? No An elevated toilet seat or a handicapped toilet? No  Timed Get Up and Go Performed: Yes. Pt ambulated 10 feet within 12 sec. Gait stead-fast and without the use of an assistive device. No intervention required at this time. Fall risk prevention has been discussed.  Community Resource Referral:  Pt declined my offer to send [employer] Referral to Care Guide for installation of grab bars in the shower, shower chair or an elevated toilet seat.  Cognitive Function:     6CIT Screen 06/08/2018  What Year? 0 points  What month? 0 points  What time? 0 points  Count back from 20 0 points  Months in reverse 2 points  Repeat phrase 2 points  Total Score 4    Screening Tests Health Maintenance  Topic Date Due  . OPHTHALMOLOGY EXAM  11/12/1960  . COLONOSCOPY  11/12/2000  . FOOT EXAM  04/20/2017  . HEMOGLOBIN A1C  07/18/2017  . INFLUENZA VACCINE  06/22/2018  . TETANUS/TDAP  07/05/2023  . Hepatitis C Screening  Completed  . PNA vac Low Risk Adult  Completed    Qualifies for Shingles Vaccine? Yes. Due for Shingrix. Education has been provided regarding the importance of this vaccine. Pt has been advised to call insurance company to determine out of pocket expense. Advised may also receive vaccine at local pharmacy or Health Dept. Verbalized acceptance and  understanding.  Overdue for Flu vaccine. Education has been provided regarding the importance of this vaccine and advised to receive when available. Verbalized acceptance and understanding.  Cancer Screenings: Lung: Low Dose CT Chest recommended if Age 58-80 years, 30 pack-year currently smoking OR have quit  w/in 15years. Patient does not qualify. Colorectal: GI referral placed today. Pt aware that he will receive a call from our office regarding his appt. Message sent to referral coordinator for scheduling purposes.  Additional Screenings: Hepatitis C Screening: Completed 05/14/13    Plan:  I have personally reviewed and addressed the Medicare Annual Wellness questionnaire and have noted the following in the patient's chart:  A. Medical and social history B. Use of alcohol, tobacco or illicit drugs  C. Current medications and supplements D. Functional ability and status E.  Nutritional status F.  Physical activity G. Advance directives H. List of other physicians I.  Hospitalizations, surgeries, and ER visits in previous 12 months J.  [place] such as hearing and vision if needed, cognitive and depression L. Referrals and appointments  In addition, I have reviewed and discussed with patient certain preventive protocols, quality metrics, and best practice recommendations. A written personalized care plan for preventive services as well as general preventive health recommendations were provided to patient.  See attached scanned questionnaire for additional information.   Signed,  Aleatha Borer, LPN Nurse Health Advisor  I have reviewed this encounter including the documentation in this note and/or discussed this patient with the provider, Aleatha Borer, LPN. I am certifying that I agree with the content of this note as supervising physician.  Steele Sizer, MD [facility] 06/08/2018, 12:51 PM

## 2018-06-08 NOTE — Patient Instructions (Addendum)
[name] , Thank you for taking time to come for your Medicare Wellness Visit. I appreciate your ongoing commitment to your health goals. Please review the following plan we discussed and let me know if I can assist you in the future.   Screening recommendations/referrals: Colorectal Screening: Ordered today. You will receive a call from our office regarding your appointment  Vision and Dental Exams: Recommended annual ophthalmology exams for early detection of glaucoma and other disorders of the eye Recommended annual dental exams for proper oral hygiene  Diabetic Exams: Recommended annual diabetic eye exams for early detection of retinopathy Recommended annual diabetic foot exams for early detection of peripheral neuropathy.  Diabetic Eye Exam: You will receive a call from our office regarding your appointment Diabetic Foot Exam: To be completed today  Vaccinations: Influenza vaccine: Overdue Pneumococcal vaccine: Up to date Tdap vaccine: Up to date Shingles vaccine: Please call your insurance company to determine your out of pocket expense for the Shingrix vaccine. You may also receive this vaccine at your local pharmacy or Health Dept.    Advanced directives: Advance directive discussed with you today. I have provided a copy for you to complete at home and have notarized. Once this is complete please bring a copy in to our office so we can scan it into your chart.  Goals: Recommend to drink at least 6-8 8oz glasses of water per day.  Next appointment: Please schedule your Annual Wellness Visit with your Nurse Health Advisor in one year.  Preventive Care 30 Years and Older, Male Preventive care refers to lifestyle choices and visits with your health care provider that can promote health and wellness. What does preventive care include?  A yearly physical exam. This is also called an annual well check.  Dental exams once or twice a year.  Routine eye exams. Ask your health care  provider how often you should have your eyes checked.  Personal lifestyle choices, including:  Daily care of your teeth and gums.  Regular physical activity.  Eating a healthy diet.  Avoiding tobacco and drug use.  Limiting alcohol use.  Practicing safe sex.  Taking low doses of aspirin every day.  Taking vitamin and mineral supplements as recommended by your health care provider. What happens during an annual well check? The services and screenings done by your health care provider during your annual well check will depend on your age, overall health, lifestyle risk factors, and family history of disease. Counseling  Your health care provider may ask you questions about your:  Alcohol use.  Tobacco use.  Drug use.  Emotional well-being.  Home and relationship well-being.  Sexual activity.  Eating habits.  History of falls.  Memory and ability to understand (cognition).  Work and work Statistician. Screening  You may have the following tests or measurements:  Height, weight, and BMI.  Blood pressure.  Lipid and cholesterol levels. These may be checked every 5 years, or more frequently if you are over 53 years old.  Skin check.  Lung cancer screening. You may have this screening every year starting at age 35 if you have a 30-pack-year history of smoking and currently smoke or have quit within the past 15 years.  Fecal occult blood test (FOBT) of the stool. You may have this test every year starting at age 4.  Flexible sigmoidoscopy or colonoscopy. You may have a sigmoidoscopy every 5 years or a colonoscopy every 10 years starting at age 40.  Prostate cancer screening. Recommendations will  vary depending on your family history and other risks.  Hepatitis C blood test.  Hepatitis B blood test.  Sexually transmitted disease (STD) testing.  Diabetes screening. This is done by checking your blood sugar (glucose) after you have not eaten for a while  (fasting). You may have this done every 1-3 years.  Abdominal aortic aneurysm (AAA) screening. You may need this if you are a current or former smoker.  Osteoporosis. You may be screened starting at age 41 if you are at high risk. Talk with your health care provider about your test results, treatment options, and if necessary, the need for more tests. Vaccines  Your health care provider may recommend certain vaccines, such as:  Influenza vaccine. This is recommended every year.  Tetanus, diphtheria, and acellular pertussis (Tdap, Td) vaccine. You may need a Td booster every 10 years.  Zoster vaccine. You may need this after age 39.  Pneumococcal 13-valent conjugate (PCV13) vaccine. One dose is recommended after age 64.  Pneumococcal polysaccharide (PPSV23) vaccine. One dose is recommended after age 24. Talk to your health care provider about which screenings and vaccines you need and how often you need them. This information is not intended to replace advice given to you by your health care provider. Make sure you discuss any questions you have with your health care provider. Document Released: 12/05/2015 Document Revised: 07/28/2016 Document Reviewed: 09/09/2015 Elsevier Interactive Patient Education  2017 [employer] Prevention in the Home Falls can cause injuries. They can happen to people of all ages. There are many things you can do to make your home safe and to help prevent falls. What can I do on the outside of my home?  Regularly fix the edges of walkways and driveways and fix any cracks.  Remove anything that might make you trip as you walk through a door, such as a raised step or threshold.  Trim any bushes or trees on the path to your home.  Use bright outdoor lighting.  Clear any walking paths of anything that might make someone trip, such as rocks or tools.  Regularly check to see if handrails are loose or broken. Make sure that both sides of any steps have  handrails.  Any raised decks and porches should have guardrails on the edges.  Have any leaves, snow, or ice cleared regularly.  Use sand or salt on walking paths during winter.  Clean up any spills in your garage right away. This includes oil or grease spills. What can I do in the bathroom?  Use night lights.  Install grab bars by the toilet and in the tub and shower. Do not use towel bars as grab bars.  Use non-skid mats or decals in the tub or shower.  If you need to sit down in the shower, use a plastic, non-slip stool.  Keep the floor dry. Clean up any water that spills on the floor as soon as it happens.  Remove soap buildup in the tub or shower regularly.  Attach bath mats securely with double-sided non-slip rug tape.  Do not have throw rugs and other things on the floor that can make you trip. What can I do in the bedroom?  Use night lights.  Make sure that you have a light by your bed that is easy to reach.  Do not use any sheets or blankets that are too big for your bed. They should not hang down onto the floor.  Have a firm chair that has  side arms. You can use this for support while you get dressed.  Do not have throw rugs and other things on the floor that can make you trip. What can I do in the kitchen?  Clean up any spills right away.  Avoid walking on wet floors.  Keep items that you use a lot in easy-to-reach places.  If you need to reach something above you, use a strong step stool that has a grab bar.  Keep electrical cords out of the way.  Do not use floor polish or wax that makes floors slippery. If you must use wax, use non-skid floor wax.  Do not have throw rugs and other things on the floor that can make you trip. What can I do with my stairs?  Do not leave any items on the stairs.  Make sure that there are handrails on both sides of the stairs and use them. Fix handrails that are broken or loose. Make sure that handrails are as long as  the stairways.  Check any carpeting to make sure that it is firmly attached to the stairs. Fix any carpet that is loose or worn.  Avoid having throw rugs at the top or bottom of the stairs. If you do have throw rugs, attach them to the floor with carpet tape.  Make sure that you have a light switch at the top of the stairs and the bottom of the stairs. If you do not have them, ask someone to add them for you. What else can I do to help prevent falls?  Wear shoes that:  Do not have high heels.  Have rubber bottoms.  Are comfortable and fit you well.  Are closed at the toe. Do not wear sandals.  If you use a stepladder:  Make sure that it is fully opened. Do not climb a closed stepladder.  Make sure that both sides of the stepladder are locked into place.  Ask someone to hold it for you, if possible.  Clearly mark and make sure that you can see:  Any grab bars or handrails.  First and last steps.  Where the edge of each step is.  Use tools that help you move around (mobility aids) if they are needed. These include:  Canes.  Walkers.  Scooters.  Crutches.  Turn on the lights when you go into a dark area. Replace any light bulbs as soon as they burn out.  Set up your furniture so you have a clear path. Avoid moving your furniture around.  If any of your floors are uneven, fix them.  If there are any pets around you, be aware of where they are.  Review your medicines with your doctor. Some medicines can make you feel dizzy. This can increase your chance of falling. Ask your doctor what other things that you can do to help prevent falls. This information is not intended to replace advice given to you by your health care provider. Make sure you discuss any questions you have with your health care provider. Document Released: 09/04/2009 Document Revised: 04/15/2016 Document Reviewed: 12/13/2014 Elsevier Interactive Patient Education  2017 [employer].

## 2018-06-12 ENCOUNTER — Other Ambulatory Visit: Payer: Self-pay | Admitting: Family Medicine

## 2018-06-26 ENCOUNTER — Encounter: Payer: Self-pay | Admitting: *Deleted

## 2018-07-11 ENCOUNTER — Other Ambulatory Visit: Payer: Self-pay | Admitting: Family Medicine

## 2018-07-11 DIAGNOSIS — E785 Hyperlipidemia, unspecified: Secondary | ICD-10-CM

## 2018-07-11 DIAGNOSIS — I10 Essential (primary) hypertension: Secondary | ICD-10-CM

## 2018-07-11 DIAGNOSIS — R809 Proteinuria, unspecified: Secondary | ICD-10-CM

## 2018-07-11 DIAGNOSIS — E1129 Type 2 diabetes mellitus with other diabetic kidney complication: Secondary | ICD-10-CM

## 2018-07-11 NOTE — Telephone Encounter (Signed)
Copied from [place] [id number]. Topic: Quick Communication - Rx Refill/Question >> Jul 11, 2018  1:04 PM Synthia Innocent wrote: Medication: amLODipine (NORVASC) 5 MG tablet, atorvastatin (LIPITOR) 40 MG tablet, glipiZIDE (GLUCOTROL XL) 5 MG 24 hr tablet, lisinopril-hydrochlorothiazide (PRINZIDE,ZESTORETIC) 20-25 MG tablet  Has the patient contacted their pharmacy? Yes.   (Agent: If no, request that the patient contact the pharmacy for the refill.) (Agent: If yes, when and what did the pharmacy advise?)  Preferred Pharmacy (with phone number or street name): [facility] [place]   Agent: Please be advised that RX refills may take up to 3 business days. We ask that you follow-up with your pharmacy.

## 2018-07-12 ENCOUNTER — Other Ambulatory Visit: Payer: Self-pay | Admitting: Family Medicine

## 2018-07-12 DIAGNOSIS — R809 Proteinuria, unspecified: Secondary | ICD-10-CM

## 2018-07-12 DIAGNOSIS — E785 Hyperlipidemia, unspecified: Secondary | ICD-10-CM

## 2018-07-12 DIAGNOSIS — E1129 Type 2 diabetes mellitus with other diabetic kidney complication: Secondary | ICD-10-CM

## 2018-07-12 DIAGNOSIS — I10 Essential (primary) hypertension: Secondary | ICD-10-CM

## 2018-07-12 NOTE — Telephone Encounter (Signed)
Request for diabetes medication. Metformin, Glipizide.  Last office visit pertaining to diabetes:  Lab Results  Component Value Date   HGBA1C >14.0 06/08/2018    Refill Request for Cholesterol medication. Atorvastatin  Lab Results  Component Value Date   CHOL 247 (H) 06/08/2018   HDL 63 06/08/2018   LDLCALC 163 (H) 06/08/2018   TRIG 100 06/08/2018   CHOLHDL 3.9 06/08/2018    Hypertension medication request: Lisinopril-HCTZ and Amlodipine   Last office visit pertaining to hypertension: 06/08/18  BP Readings from Last 3 Encounters:  06/08/18 (!) 172/98  06/08/18 (!) 172/98  05/03/18 (!) 184/85    Lab Results  Component Value Date   CREATININE 1.19 06/08/2018   BUN 12 06/08/2018   NA 137 06/08/2018   K 4.1 06/08/2018   CL 102 06/08/2018   CO2 25 06/08/2018     Follow up 07/19/18

## 2018-07-19 ENCOUNTER — Encounter: Payer: Self-pay | Admitting: Family Medicine

## 2018-07-19 ENCOUNTER — Ambulatory Visit (INDEPENDENT_AMBULATORY_CARE_PROVIDER_SITE_OTHER): Payer: Medicare HMO | Admitting: Family Medicine

## 2018-07-19 VITALS — BP 136/74 | HR 82 | Temp 98.7°F | Resp 16 | Ht 73.0 in | Wt 233.2 lb

## 2018-07-19 DIAGNOSIS — Z23 Encounter for immunization: Secondary | ICD-10-CM

## 2018-07-19 DIAGNOSIS — R809 Proteinuria, unspecified: Secondary | ICD-10-CM

## 2018-07-19 DIAGNOSIS — E785 Hyperlipidemia, unspecified: Secondary | ICD-10-CM

## 2018-07-19 DIAGNOSIS — I10 Essential (primary) hypertension: Secondary | ICD-10-CM | POA: Diagnosis not present

## 2018-07-19 DIAGNOSIS — E1129 Type 2 diabetes mellitus with other diabetic kidney complication: Secondary | ICD-10-CM

## 2018-07-19 LAB — POCT GLYCOSYLATED HEMOGLOBIN (HGB A1C): Hemoglobin A1C: 10.6 % — AB (ref 4.0–5.6)

## 2018-07-19 MED ORDER — METFORMIN HCL ER 750 MG PO TB24: 1500.0000 mg | ORAL_TABLET | Freq: Every day | ORAL | 0 refills | Status: DC

## 2018-07-19 MED ORDER — GLIPIZIDE ER 5 MG PO TB24: ORAL_TABLET | ORAL | 0 refills | Status: DC

## 2018-07-19 MED ORDER — AMLODIPINE BESYLATE 5 MG PO TABS: 5.0000 mg | ORAL_TABLET | Freq: Every day | ORAL | 0 refills | Status: DC

## 2018-07-19 MED ORDER — LISINOPRIL-HYDROCHLOROTHIAZIDE 20-25 MG PO TABS: 1.0000 | ORAL_TABLET | Freq: Every day | ORAL | 0 refills | Status: DC

## 2018-07-19 MED ORDER — ATORVASTATIN CALCIUM 40 MG PO TABS: 40.0000 mg | ORAL_TABLET | Freq: Every day | ORAL | 0 refills | Status: DC

## 2018-07-19 NOTE — Progress Notes (Signed)
Name: Austin Ortega   MRN: [id number]    DOB: 03/16/1951 (67)   Date:07/19/2018       Progress Note  Subjective  Chief Complaint  Chief Complaint  Patient presents with  . Follow-up    patient is here for a 1 month f/u  . Diabetes  . Hypertension  . Results    HPI   DMII:hgbA1Chas gone down from 14.5 % in May to 8.5%Feb 2018 but ran out  of medication and it went up again  to 14 after one month of resuming Metformin 1000 mg BIC and glipizide 5 mg XL and also changing diet and exercising more the hgbA1C is down to 10.6%. Fasting sugar at home has been between 97-117, the highest after 178 . He also had 100 of microalbumin. He states nocturia is down to at most once per night, no polyphagia or polyuria. We will switch  Metformin to 750 mg ER two daily, continue Glipizide 5 X  HTN: bp at home can go as low as 116/60, but usually around 120's/70's. No chest pain or palpitation. No dizziness.   Hyperlipidemia:he was out of medication and LDL was elevated at 163 but is taking Atorvastatin daily again and we will recheck next visit   Morbid obesity: he has changed his diet, he is no longer drinking sodas ( he was drinking 3 cans per day) he is also cutting down on sweet snacks like honeybun and oatmeal cakes. Discussed importance of packing his own snacks for the golf course.    Patient Active Problem List   Diagnosis Date Noted  . Non compliance with medical treatment 06/08/2018  . Umbilical hernia without obstruction and without gangrene 04/21/2016  . Vitiligo 04/21/2016  . Morbid obesity ([place]) 04/20/2016  . Decreased libido 03/04/2009  . Pure hypercholesterolemia 10/01/2008  . Benign essential HTN 08/27/2008  . Type 2 diabetes mellitus with microalbuminuria ([place]) 08/27/2008    History reviewed. No pertinent surgical history.  Family History  Problem Relation Age of Onset  . Diabetes Mother   . Hypertension Mother   . Cancer Father        Lung  . Hypertension Sister   .  Diabetes Sister   . Hypertension Brother   . Hypertension Brother   . Heart attack Brother     Social History   Socioeconomic History  . Marital status: Married    Spouse name: Vaughan Basta  . Number of children: 4  . Years of education: some college  . Highest education level: 12th grade  Occupational History  . 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 Financial resource strain: Not hard at all  . Food insecurity:    Worry: Never true    Inability: Never true  . Transportation needs:    Medical: No    Non-medical: No  Tobacco Use  . Smoking status: Former Smoker    Packs/day: 0.50    Years: 3.00    Pack years: 1.50    Types: Cigarettes    Start date: 04/20/1978    Last attempt to quit: 04/20/1981    Years since quitting: 37.2  . Smokeless tobacco: Never Used  . Tobacco comment: more than 40 years ago  Substance and Sexual Activity  . Alcohol use: No    Alcohol/week: 0.0 standard drinks  . Drug use: No  . Sexual activity: Yes    Partners: Female  Lifestyle  . Physical activity:    Days per week: 7  days    Minutes per session: 150+ min  . Stress: Not at all  Relationships  . Social connections:    Talks on phone: More than three times a week    Gets together: More than three times a week    Attends religious service: More than 4 times per year    Active member of club or organization: Yes    Attends meetings of clubs or organizations: More than 4 times per year    Relationship status: Married  . Intimate partner violence:    Fear of current or ex partner: No    Emotionally abused: No    Physically abused: No    Forced sexual activity: No  Other Topics Concern  . Not on file  Social History Narrative  . Not on file     Current Outpatient Medications:  .  amLODipine (NORVASC) 5 MG tablet, Take 1 tablet (5 mg total) by mouth daily., Disp: 90 tablet, Rfl: 0 .  atorvastatin (LIPITOR) 40 MG tablet, Take 1 tablet (40 mg total) by mouth  daily., Disp: 90 tablet, Rfl: 0 .  Blood Glucose Monitoring Suppl (FREESTYLE FREEDOM LITE) w/Device KIT, , Disp: , Rfl:  .  glipiZIDE (GLIPIZIDE XL) 5 MG 24 hr tablet, TAKE 1 TABLET BY MOUTH ONCE DAILY WITH BREAKFAST, Disp: 90 tablet, Rfl: 0 .  lisinopril-hydrochlorothiazide (PRINZIDE,ZESTORETIC) 20-25 MG tablet, Take 1 tablet by mouth daily., Disp: 90 tablet, Rfl: 0 .  aspirin (ASPIRIN ADULT LOW DOSE) 81 MG EC tablet, Take by mouth., Disp: , Rfl:  .  metFORMIN (GLUCOPHAGE-XR) 750 MG 24 hr tablet, Take 2 tablets (1,500 mg total) by mouth daily with breakfast., Disp: 180 tablet, Rfl: 0  Allergies  Allergen Reactions  . Shrimp [Shellfish Allergy] Swelling     ROS  Constitutional: Negative for fever or weight change.  Respiratory: Negative for cough and shortness of breath.   Cardiovascular: Negative for chest pain or palpitations.  Gastrointestinal: Negative for abdominal pain, no bowel changes.  Musculoskeletal: Negative for gait problem or joint swelling.  Skin: Negative for rash.  Neurological: Negative for dizziness or headache.  No other specific complaints in a complete review of systems (except as listed in HPI above).  Objective  Vitals:   07/19/18 1131  BP: 136/74  Pulse: 82  Resp: 16  Temp: 98.7 F (37.1 C)  TempSrc: Oral  SpO2: 99%  Weight: 233 lb 3.2 oz (105.8 kg)  Height: _0  (1.854 m)    Body mass index is 30.77 kg/m.  Physical Exam  Constitutional: Patient appears well-developed and well-nourished. Obese No distress.  HEENT: head atraumatic, normocephalic, pupils equal and reactive to light,  neck supple, throat within normal limits Cardiovascular: Normal rate, regular rhythm and normal heart sounds.  No murmur heard. No BLE edema. Pulmonary/Chest: Effort normal and breath sounds normal. No respiratory distress. Abdominal: Soft.  There is no tenderness. Psychiatric: Patient has a normal mood and affect. behavior is normal. Judgment and thought content  normal.  Recent Results (from the past 2160 hour(s))  POCT HgB A1C     Status: None   Collection Time: 06/08/18  9:37 AM  Result Value Ref Range   Hemoglobin A1C  4.0 - 5.6 %   HbA1c POC (<> result, manual entry) >14.0 4.0 - 5.6 %   HbA1c, POC (prediabetic range)  5.7 - 6.4 %   HbA1c, POC (controlled diabetic range)  0.0 - 7.0 %  CBC with Differential/Platelet     Status: Abnormal  Collection Time: 06/08/18 10:25 AM  Result Value Ref Range   WBC 6.2 3.8 - 10.8 Thousand/uL   RBC 5.08 4.20 - 5.80 Million/uL   Hemoglobin 14.0 13.2 - 17.1 g/dL   HCT 44.2 38.5 - 50.0 %   MCV 87.0 80.0 - 100.0 fL   MCH 27.6 27.0 - 33.0 pg   MCHC 31.7 (L) 32.0 - 36.0 g/dL   RDW 12.8 11.0 - 15.0 %   Platelets 210 140 - 400 Thousand/uL   MPV 10.6 7.5 - 12.5 fL   Neutro Abs 4,185 1,500 - 7,800 cells/uL   Lymphs Abs 1,476 850 - 3,900 cells/uL   WBC mixed population 353 200 - 950 cells/uL   Eosinophils Absolute 149 15 - 500 cells/uL   Basophils Absolute 37 0 - 200 cells/uL   Neutrophils Relative % 67.5 %   Total Lymphocyte 23.8 %   Monocytes Relative 5.7 %   Eosinophils Relative 2.4 %   Basophils Relative 0.6 %  COMPLETE METABOLIC PANEL WITH GFR     Status: Abnormal   Collection Time: 06/08/18 10:25 AM  Result Value Ref Range   Glucose, Bld 349 (H) 65 - 99 mg/dL    Comment: .            Fasting reference interval . For someone without known diabetes, a glucose value >125 mg/dL indicates that they may have diabetes and this should be confirmed with a follow-up test. .    BUN 12 7 - 25 mg/dL   Creat 1.19 0.70 - 1.25 mg/dL    Comment: For patients >7 years of age, the reference limit for Creatinine is approximately 13% higher for people identified as African-American. .    GFR, Est Non African American 63 > OR = 60 mL/min/1.2m   GFR, Est African American 73 > OR = 60 mL/min/1.781m  BUN/Creatinine Ratio NOT APPLICABLE 6 - 22 (calc)   Sodium 137 135 - 146 mmol/L   Potassium 4.1 3.5 - 5.3  mmol/L   Chloride 102 98 - 110 mmol/L   CO2 25 20 - 32 mmol/L   Calcium 9.3 8.6 - 10.3 mg/dL   Total Protein 6.9 6.1 - 8.1 g/dL   Albumin 4.1 3.6 - 5.1 g/dL   Globulin 2.8 1.9 - 3.7 g/dL (calc)   AG Ratio 1.5 1.0 - 2.5 (calc)   Total Bilirubin 0.7 0.2 - 1.2 mg/dL   Alkaline phosphatase (APISO) 105 40 - 115 U/L   AST 13 10 - 35 U/L   ALT 15 9 - 46 U/L  Lipid panel     Status: Abnormal   Collection Time: 06/08/18 10:25 AM  Result Value Ref Range   Cholesterol 247 (H) <200 mg/dL   HDL 63 >40 mg/dL   Triglycerides 100 <150 mg/dL   LDL Cholesterol (Calc) 163 (H) mg/dL (calc)    Comment: Reference range: <100 . Desirable range <100 mg/dL for primary prevention;   <70 mg/dL for patients with CHD or diabetic patients  with > or = 2 CHD risk factors. . Marland KitchenDL-C is now calculated using the Martin-Hopkins  calculation, which is a validated novel method providing  better accuracy than the Friedewald equation in the  estimation of LDL-C.  MaCresenciano Genret al. JAAnnamaria [id number];[id number]  (http://education.[facility].com/faq/FAQ164)    Total CHOL/HDL Ratio 3.9 <5.0 (calc)   Non-HDL Cholesterol (Calc) 184 (H) <130 mg/dL (calc)    Comment: For patients with diabetes plus 1 major ASCVD risk  factor, treating to a non-HDL-C goal  of <100 mg/dL  (LDL-C of <70 mg/dL) is considered a therapeutic  option.   POCT glycosylated hemoglobin (Hb A1C)     Status: Abnormal   Collection Time: 07/19/18 11:43 AM  Result Value Ref Range   Hemoglobin A1C 10.6 (A) 4.0 - 5.6 %   HbA1c POC (<> result, manual entry)     HbA1c, POC (prediabetic range)     HbA1c, POC (controlled diabetic range)        PHQ2/9: Depression screen [facility] 2/9 07/19/2018 06/08/2018 01/18/2017 07/21/2016 04/20/2016  Decreased Interest 0 0 0 0 0  Down, Depressed, Hopeless 0 0 0 0 0  PHQ - 2 Score 0 0 0 0 0  Altered sleeping 0 0 - - -  Tired, decreased energy 0 0 - - -  Change in appetite 0 0 - - -  Feeling bad or failure about  yourself  0 0 - - -  Trouble concentrating 0 0 - - -  Moving slowly or fidgety/restless 0 0 - - -  Suicidal thoughts 0 0 - - -  PHQ-9 Score 0 0 - - -  Difficult doing work/chores Not difficult at all Not difficult at all - - -     Fall Risk: Fall Risk  07/19/2018 06/08/2018 01/18/2017 07/21/2016 04/20/2016  Falls in the past year? _0   Risk for fall due to : - Impaired vision - - -  Risk for fall due to: Comment - wears eyeglasses - - -     Functional Status Survey: Is the patient deaf or have difficulty hearing?: No(denies hearing aids) Does the patient have difficulty seeing, even when wearing glasses/contacts?: No(wears eyeglasses) Does the patient have difficulty concentrating, remembering, or making decisions?: No Does the patient have difficulty walking or climbing stairs?: No Does the patient have difficulty dressing or bathing?: No Does the patient have difficulty doing errands alone such as visiting a doctor's office or shopping?: No    Assessment & Plan  1. Type 2 diabetes mellitus with microalbuminuria, without long-term current use of insulin (HCC)  Improved, never took [place] - POCT glycosylated hemoglobin (Hb A1C) - glipiZIDE (GLIPIZIDE XL) 5 MG 24 hr tablet; TAKE 1 TABLET BY MOUTH ONCE DAILY WITH BREAKFAST  Dispense: 90 tablet; Refill: 0 - lisinopril-hydrochlorothiazide (PRINZIDE,ZESTORETIC) 20-25 MG tablet; Take 1 tablet by mouth daily.  Dispense: 90 tablet; Refill: 0 - Hemoglobin A1c - metFORMIN (GLUCOPHAGE-XR) 750 MG 24 hr tablet; Take 2 tablets (1,500 mg total) by mouth daily with breakfast.  Dispense: 180 tablet; Refill: 0  2. Benign essential HTN  - amLODipine (NORVASC) 5 MG tablet; Take 1 tablet (5 mg total) by mouth daily.  Dispense: 90 tablet; Refill: 0 - lisinopril-hydrochlorothiazide (PRINZIDE,ZESTORETIC) 20-25 MG tablet; Take 1 tablet by mouth daily.  Dispense: 90 tablet; Refill: 0 - COMPLETE METABOLIC PANEL WITH GFR  3. Dyslipidemia  -  atorvastatin (LIPITOR) 40 MG tablet; Take 1 tablet (40 mg total) by mouth daily.  Dispense: 90 tablet; Refill: 0 - Lipid panel  4. Needs flu shot  - Flu vaccine HIGH DOSE PF
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
## 2018-10-24 ENCOUNTER — Encounter: Payer: Self-pay | Admitting: Family Medicine

## 2018-10-24 ENCOUNTER — Ambulatory Visit (INDEPENDENT_AMBULATORY_CARE_PROVIDER_SITE_OTHER): Payer: Medicare HMO | Admitting: Family Medicine

## 2018-10-24 VITALS — BP 128/72 | HR 80 | Temp 98.0°F | Resp 16 | Ht 73.0 in | Wt 245.6 lb

## 2018-10-24 DIAGNOSIS — R809 Proteinuria, unspecified: Secondary | ICD-10-CM

## 2018-10-24 DIAGNOSIS — E1169 Type 2 diabetes mellitus with other specified complication: Secondary | ICD-10-CM

## 2018-10-24 DIAGNOSIS — E785 Hyperlipidemia, unspecified: Secondary | ICD-10-CM | POA: Diagnosis not present

## 2018-10-24 DIAGNOSIS — E1129 Type 2 diabetes mellitus with other diabetic kidney complication: Secondary | ICD-10-CM | POA: Diagnosis not present

## 2018-10-24 DIAGNOSIS — I10 Essential (primary) hypertension: Secondary | ICD-10-CM | POA: Diagnosis not present

## 2018-10-24 DIAGNOSIS — Z1211 Encounter for screening for malignant neoplasm of colon: Secondary | ICD-10-CM

## 2018-10-24 LAB — POCT GLYCOSYLATED HEMOGLOBIN (HGB A1C): HEMOGLOBIN A1C: 6.8 % — AB (ref 4.0–5.6)

## 2018-10-24 MED ORDER — AMLODIPINE BESYLATE 5 MG PO TABS: 5.0000 mg | ORAL_TABLET | Freq: Every day | ORAL | 1 refills | Status: DC

## 2018-10-24 MED ORDER — METFORMIN HCL ER 750 MG PO TB24: 1500.0000 mg | ORAL_TABLET | Freq: Every day | ORAL | 1 refills | Status: DC

## 2018-10-24 MED ORDER — LISINOPRIL-HYDROCHLOROTHIAZIDE 20-25 MG PO TABS: 1.0000 | ORAL_TABLET | Freq: Every day | ORAL | 1 refills | Status: DC

## 2018-10-24 MED ORDER — GLIPIZIDE ER 5 MG PO TB24: ORAL_TABLET | ORAL | 1 refills | Status: DC

## 2018-10-24 MED ORDER — ATORVASTATIN CALCIUM 40 MG PO TABS: 40.0000 mg | ORAL_TABLET | Freq: Every day | ORAL | 1 refills | Status: DC

## 2018-10-24 NOTE — Progress Notes (Signed)
Name: Austin Ortega   MRN: [id number]    DOB: 1951-01-02   Date:10/24/2018       Progress Note  Subjective  Chief Complaint  Chief Complaint  Patient presents with  . Follow-up    3 mth f/u  . Diabetes  . Hypertension  . Dyslipidemia  . Orders    cologuard    HPI  DMII:hgbA1Cwas out of control at  14.5 % in May to 8.5% Feb 2018 but ran out  of medication and it went up again to 04 June 2018 after one month of resuming Metformin 1500 daily  and glipizide 5 mg XL and also changing diet and exercising more the hgbA1C is down to 10.6%, now after 4  months down to 6.8%. Fasting sugar at home has been between 90's-130, he has stopped drinking sugary beverages daily and when he makes tea he uses less sugar than before, advised to switch to Equal .   Healso had 100 of microalbumin. He states nocturia is down to at most once per night, no polyphagia or polyuria.   HTN: bp at home can go as low as 116/60, but usually around 120's/70's. No chest pain or palpitation. No dizziness.   Hyperlipidemia:he was out of medication and LDL was elevated at 163 but is taking Atorvastatin daily again and we will recheck today   Morbid obesity: he has changed his diet, he is no longer drinking sodas ( he was drinking 3 cans per day), he states no longer having honey buns. He has gained weight , but likely because glucose is getting controlled.    Patient Active Problem List   Diagnosis Date Noted  . Non compliance with medical treatment 06/08/2018  . Umbilical hernia without obstruction and without gangrene 04/21/2016  . Vitiligo 04/21/2016  . Morbid obesity ([place]) 04/20/2016  . Decreased libido 03/04/2009  . Pure hypercholesterolemia 10/01/2008  . Benign essential HTN 08/27/2008  . Type 2 diabetes mellitus with microalbuminuria ([place]) 08/27/2008    History reviewed. No pertinent surgical history.  Family History  Problem Relation Age of Onset  . Diabetes Mother   . Hypertension Mother   .  Cancer Father        Lung  . Hypertension Sister   . Diabetes Sister   . Hypertension Brother   . Hypertension Brother   . Heart attack Brother     Social History   Socioeconomic History  . Marital status: Married    Spouse name: Vaughan Basta  . Number of children: 4  . Years of education: some college  . Highest education level: 12th grade  Occupational History  . 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 Financial resource strain: Not hard at all  . Food insecurity:    Worry: Never true    Inability: Never true  . Transportation needs:    Medical: No    Non-medical: No  Tobacco Use  . Smoking status: Former Smoker    Packs/day: 0.50    Years: 3.00    Pack years: 1.50    Types: Cigarettes    Start date: 04/20/1978    Last attempt to quit: 04/20/1981    Years since quitting: 37.5  . Smokeless tobacco: Never Used  . Tobacco comment: more than 40 years ago  Substance and Sexual Activity  . Alcohol use: No    Alcohol/week: 0.0 standard drinks  . Drug use: No  . Sexual activity: Yes    Partners: Female  Lifestyle  . Physical activity:    Days per week: 7 days    Minutes per session: 150+ min  . Stress: Not at all  Relationships  . Social connections:    Talks on phone: More than three times a week    Gets together: More than three times a week    Attends religious service: More than 4 times per year    Active member of club or organization: Yes    Attends meetings of clubs or organizations: More than 4 times per year    Relationship status: Married  . Intimate partner violence:    Fear of current or ex partner: No    Emotionally abused: No    Physically abused: No    Forced sexual activity: No  Other Topics Concern  . Not on file  Social History Narrative  . Not on file     Current Outpatient Medications:  .  amLODipine (NORVASC) 5 MG tablet, Take 1 tablet (5 mg total) by mouth daily., Disp: 90 tablet, Rfl: 0 .  aspirin (ASPIRIN  ADULT LOW DOSE) 81 MG EC tablet, Take by mouth., Disp: , Rfl:  .  atorvastatin (LIPITOR) 40 MG tablet, Take 1 tablet (40 mg total) by mouth daily., Disp: 90 tablet, Rfl: 0 .  Blood Glucose Monitoring Suppl (FREESTYLE FREEDOM LITE) w/Device KIT, , Disp: , Rfl:  .  glipiZIDE (GLIPIZIDE XL) 5 MG 24 hr tablet, TAKE 1 TABLET BY MOUTH ONCE DAILY WITH BREAKFAST, Disp: 90 tablet, Rfl: 0 .  lisinopril-hydrochlorothiazide (PRINZIDE,ZESTORETIC) 20-25 MG tablet, Take 1 tablet by mouth daily., Disp: 90 tablet, Rfl: 0 .  metFORMIN (GLUCOPHAGE-XR) 750 MG 24 hr tablet, Take 2 tablets (1,500 mg total) by mouth daily with breakfast., Disp: 180 tablet, Rfl: 0  Allergies  Allergen Reactions  . Shrimp [Shellfish Allergy] Swelling    I personally reviewed active problem list, medication list, allergies, family history, social history with the patient/caregiver today.   ROS  Constitutional: Negative for fever, positive for weight change.  Respiratory: Negative for cough and shortness of breath.   Cardiovascular: Negative for chest pain or palpitations.  Gastrointestinal: Negative for abdominal pain, no bowel changes.  Musculoskeletal: Negative for gait problem or joint swelling.  Skin: Negative for rash.  Neurological: Negative for dizziness or headache.  No other specific complaints in a complete review of systems (except as listed in HPI above).  Objective  Vitals:   10/24/18 0918  BP: 128/72  Pulse: 80  Resp: 16  Temp: 98 F (36.7 C)  TempSrc: Oral  SpO2: 99%  Weight: 245 lb 9.6 oz (111.4 kg)  Height: '6\' 1"'  (1.854 m)    Body mass index is 32.4 kg/m.  Physical Exam  Constitutional: Patient appears well-developed and well-nourished. Obese No distress.  HEENT: head atraumatic, normocephalic, pupils equal and reactive to light,  neck supple, throat within normal limits Cardiovascular: Normal rate, regular rhythm and normal heart sounds.  No murmur heard. No BLE edema. Pulmonary/Chest: Effort  normal and breath sounds normal. No respiratory distress. Abdominal: Soft.  There is no tenderness. Psychiatric: Patient has a normal mood and affect. behavior is normal. Judgment and thought content normal.  Recent Results (from the past 2160 hour(s))  POCT glycosylated hemoglobin (Hb A1C)     Status: Abnormal   Collection Time: 10/24/18  9:26 AM  Result Value Ref Range   Hemoglobin A1C 6.8 (A) 4.0 - 5.6 %   HbA1c POC (<> result, manual entry)     HbA1c,  POC (prediabetic range)     HbA1c, POC (controlled diabetic range)       PHQ2/9: Depression screen [facility] 2/9 10/24/2018 07/19/2018 06/08/2018 01/18/2017 07/21/2016  Decreased Interest 0 0 0 0 0  Down, Depressed, Hopeless 0 0 0 0 0  PHQ - 2 Score 0 0 0 0 0  Altered sleeping 0 0 0 - -  Tired, decreased energy 0 0 0 - -  Change in appetite 0 0 0 - -  Feeling bad or failure about yourself  0 0 0 - -  Trouble concentrating 0 0 0 - -  Moving slowly or fidgety/restless 0 0 0 - -  Suicidal thoughts 0 0 0 - -  PHQ-9 Score 0 0 0 - -  Difficult doing work/chores Not difficult at all Not difficult at all Not difficult at all - -     Fall Risk: Fall Risk  10/24/2018 07/19/2018 06/08/2018 01/18/2017 07/21/2016  Falls in the past year? 0 No No No No  Number falls in past yr: 0 - - - -  Injury with Fall? 0 - - - -  Risk for fall due to : - - Impaired vision - -  Risk for fall due to: Comment - - wears eyeglasses - -     Functional Status Survey: Is the patient deaf or have difficulty hearing?: No Does the patient have difficulty seeing, even when wearing glasses/contacts?: No Does the patient have difficulty concentrating, remembering, or making decisions?: No Does the patient have difficulty walking or climbing stairs?: No Does the patient have difficulty dressing or bathing?: No Does the patient have difficulty doing errands alone such as visiting a doctor's office or shopping?: No    Assessment & Plan  1. Type 2 diabetes mellitus with  microalbuminuria, without long-term current use of insulin (HCC)  - POCT glycosylated hemoglobin (Hb A1C) - metFORMIN (GLUCOPHAGE-XR) 750 MG 24 hr tablet; Take 2 tablets (1,500 mg total) by mouth daily with breakfast.  Dispense: 180 tablet; Refill: 1 - glipiZIDE (GLIPIZIDE XL) 5 MG 24 hr tablet; TAKE 1 TABLET BY MOUTH ONCE DAILY WITH BREAKFAST  Dispense: 90 tablet; Refill: 1 - lisinopril-hydrochlorothiazide (PRINZIDE,ZESTORETIC) 20-25 MG tablet; Take 1 tablet by mouth daily.  Dispense: 90 tablet; Refill: 1  2. Dyslipidemia  - atorvastatin (LIPITOR) 40 MG tablet; Take 1 tablet (40 mg total) by mouth daily.  Dispense: 90 tablet; Refill: 1  3. Benign essential HTN  - amLODipine (NORVASC) 5 MG tablet; Take 1 tablet (5 mg total) by mouth daily.  Dispense: 90 tablet; Refill: 1 - lisinopril-hydrochlorothiazide (PRINZIDE,ZESTORETIC) 20-25 MG tablet; Take 1 tablet by mouth daily.  Dispense: 90 tablet; Refill: 1  4. Dyslipidemia associated with type 2 diabetes mellitus (HCC)  - atorvastatin (LIPITOR) 40 MG tablet; Take 1 tablet (40 mg total) by mouth daily.  Dispense: 90 tablet; Refill: 1   5. Colon cancer screening  - Cologuard

## 2018-10-25 LAB — COMPLETE METABOLIC PANEL WITH GFR
AG Ratio: 1.5 (calc) (ref 1.0–2.5)
ALKALINE PHOSPHATASE (APISO): 83 U/L (ref 40–115)
ALT: 11 U/L (ref 9–46)
AST: 13 U/L (ref 10–35)
Albumin: 4.2 g/dL (ref 3.6–5.1)
BILIRUBIN TOTAL: 0.6 mg/dL (ref 0.2–1.2)
BUN/Creatinine Ratio: 16 (calc) (ref 6–22)
BUN: 21 mg/dL (ref 7–25)
CHLORIDE: 105 mmol/L (ref 98–110)
CO2: 28 mmol/L (ref 20–32)
Calcium: 9.5 mg/dL (ref 8.6–10.3)
Creat: 1.33 mg/dL — ABNORMAL HIGH (ref 0.70–1.25)
GFR, EST AFRICAN AMERICAN: 64 mL/min/{1.73_m2} (ref >=60)
GFR, Est Non African American: 55 mL/min/{1.73_m2} — ABNORMAL LOW (ref >=60)
GLUCOSE: 134 mg/dL — AB (ref 65–99)
Globulin: 2.8 g/dL (calc) (ref 1.9–3.7)
Potassium: 4.6 mmol/L (ref 3.5–5.3)
Sodium: 140 mmol/L (ref 135–146)
TOTAL PROTEIN: 7 g/dL (ref 6.1–8.1)

## 2018-10-25 LAB — MICROALBUMIN / CREATININE URINE RATIO
CREATININE, URINE: 134 mg/dL (ref 20–320)
MICROALB UR: 0.9 mg/dL
Microalb Creat Ratio: 7 mcg/mg creat (ref <30)

## 2018-10-25 LAB — LIPID PANEL
CHOL/HDL RATIO: 2.6 (calc) (ref <5.0)
CHOLESTEROL: 141 mg/dL (ref <200)
HDL: 55 mg/dL (ref >40)
LDL Cholesterol (Calc): 72 mg/dL (calc)
Non-HDL Cholesterol (Calc): 86 mg/dL (calc) (ref <130)
Triglycerides: 67 mg/dL (ref <150)

## 2019-02-26 ENCOUNTER — Ambulatory Visit: Payer: Medicare HMO | Admitting: Family Medicine

## 2019-05-22 ENCOUNTER — Other Ambulatory Visit: Payer: Self-pay | Admitting: Family Medicine

## 2019-05-22 DIAGNOSIS — I10 Essential (primary) hypertension: Secondary | ICD-10-CM

## 2019-05-22 DIAGNOSIS — E1169 Type 2 diabetes mellitus with other specified complication: Secondary | ICD-10-CM

## 2019-05-22 DIAGNOSIS — E785 Hyperlipidemia, unspecified: Secondary | ICD-10-CM

## 2019-05-22 DIAGNOSIS — E1129 Type 2 diabetes mellitus with other diabetic kidney complication: Secondary | ICD-10-CM

## 2019-05-22 NOTE — Telephone Encounter (Signed)
Pt scheduled appt for 7.15.2020 with Dr Ancil Boozer. However he is asking for a refill to last until appt. He also stated that his metformin has been recalled

## 2019-05-23 NOTE — Telephone Encounter (Signed)
Hypertension medication request: Amlodipine, Lisinopril-HCTZ  Last office visit pertaining to hypertension: 10/24/2018   BP Readings from Last 3 Encounters:  10/24/18 128/72  07/19/18 136/74  06/08/18 (!) 172/98    Lab Results  Component Value Date   CREATININE 1.33 (H) 10/24/2018   BUN 21 10/24/2018   NA 140 10/24/2018   K 4.6 10/24/2018   CL 105 10/24/2018   CO2 28 10/24/2018   Request for diabetes medication. Glipizide  And Metformin   Lab Results  Component Value Date   HGBA1C 6.8 (A) 10/24/2018   Refill Request for Cholesterol medication. Atorvastatin   Lab Results  Component Value Date   CHOL 141 10/24/2018   HDL 55 10/24/2018   LDLCALC 72 10/24/2018   TRIG 67 10/24/2018   CHOLHDL 2.6 10/24/2018    Follow up on 06/06/2019

## 2019-06-06 ENCOUNTER — Ambulatory Visit (INDEPENDENT_AMBULATORY_CARE_PROVIDER_SITE_OTHER): Payer: Medicare HMO | Admitting: Family Medicine

## 2019-06-06 ENCOUNTER — Encounter: Payer: Self-pay | Admitting: Family Medicine

## 2019-06-06 ENCOUNTER — Other Ambulatory Visit: Payer: Self-pay

## 2019-06-06 VITALS — BP 134/78 | HR 89 | Temp 96.9°F | Resp 16 | Ht 73.0 in | Wt 243.4 lb

## 2019-06-06 DIAGNOSIS — E1129 Type 2 diabetes mellitus with other diabetic kidney complication: Secondary | ICD-10-CM | POA: Diagnosis not present

## 2019-06-06 DIAGNOSIS — I10 Essential (primary) hypertension: Secondary | ICD-10-CM

## 2019-06-06 DIAGNOSIS — R809 Proteinuria, unspecified: Secondary | ICD-10-CM | POA: Diagnosis not present

## 2019-06-06 DIAGNOSIS — E1169 Type 2 diabetes mellitus with other specified complication: Secondary | ICD-10-CM | POA: Diagnosis not present

## 2019-06-06 DIAGNOSIS — E785 Hyperlipidemia, unspecified: Secondary | ICD-10-CM

## 2019-06-06 LAB — POCT GLYCOSYLATED HEMOGLOBIN (HGB A1C): HbA1c, POC (controlled diabetic range): 7.4 % — AB (ref 0.0–7.0)

## 2019-06-06 MED ORDER — AMLODIPINE BESYLATE 5 MG PO TABS: 5.0000 mg | ORAL_TABLET | Freq: Every day | ORAL | 0 refills | Status: DC

## 2019-06-06 MED ORDER — GLIPIZIDE ER 5 MG PO TB24: ORAL_TABLET | ORAL | 0 refills | Status: DC

## 2019-06-06 MED ORDER — ATORVASTATIN CALCIUM 40 MG PO TABS: 40.0000 mg | ORAL_TABLET | Freq: Every day | ORAL | 0 refills | Status: DC

## 2019-06-06 MED ORDER — LISINOPRIL-HYDROCHLOROTHIAZIDE 20-25 MG PO TABS: 1.0000 | ORAL_TABLET | Freq: Every day | ORAL | 0 refills | Status: DC

## 2019-06-06 MED ORDER — METFORMIN HCL ER 750 MG PO TB24: 1500.0000 mg | ORAL_TABLET | Freq: Every day | ORAL | 0 refills | Status: DC

## 2019-06-06 NOTE — Progress Notes (Signed)
Name: Austin Ortega   MRN: [id number]    DOB: 1951-08-12   Date:06/06/2019       Progress Note  Subjective  Chief Complaint  Chief Complaint  Patient presents with  . Medication Refill  . Diabetes    Checks twice weekly Average 120-130  . Hypertension    Denies any symptoms  . Dyslipidemia  . Morbid Obesity    HPI  DMII:hgbA1Cis still much better than used to be down from over 10 to 7.4%, but last visit it was down to 6.8%. He states still not very compliant with diet, but not drinking as many sodas and has been compliant with his medications.  Fasting sugar at home has been between 100-130's. Denies hypoglycemic episodes,.  Healso had 100 of microalbumin, he is on ACE .He denies polyphagia, polydipsia, nocturia at most twice sometimes does not get up.   HTN:bp at home is 127/70 last week. No chest pain, palpitation or SOB. Compliant with medication now  Hyperlipidemia:he is taking statin therapy, no side effects, last LDL was at goal   Patient Active Problem List   Diagnosis Date Noted  . Non compliance with medical treatment 06/08/2018  . Umbilical hernia without obstruction and without gangrene 04/21/2016  . Vitiligo 04/21/2016  . Morbid obesity ([place]) 04/20/2016  . Decreased libido 03/04/2009  . Pure hypercholesterolemia 10/01/2008  . Benign essential HTN 08/27/2008  . Type 2 diabetes mellitus with microalbuminuria ([place]) 08/27/2008    History reviewed. No pertinent surgical history.  Family History  Problem Relation Age of Onset  . Diabetes Mother   . Hypertension Mother   . Cancer Father        Lung  . Hypertension Sister   . Diabetes Sister   . Hypertension Brother   . Hypertension Brother   . Heart attack Brother     Social History   Socioeconomic History  . Marital status: Married    Spouse name: Vaughan Basta  . Number of children: 4  . Years of education: some college  . Highest education level: 12th grade  Occupational History  . 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 Financial resource strain: Not hard at all  . Food insecurity    Worry: Never true    Inability: Never true  . Transportation needs    Medical: No    Non-medical: No  Tobacco Use  . Smoking status: Former Smoker    Packs/day: 0.50    Years: 3.00    Pack years: 1.50    Types: Cigarettes    Start date: 04/20/1978    Quit date: 04/20/1981    Years since quitting: 38.1  . Smokeless tobacco: Never Used  . Tobacco comment: more than 40 years ago  Substance and Sexual Activity  . Alcohol use: No    Alcohol/week: 0.0 standard drinks  . Drug use: No  . Sexual activity: Yes    Partners: Female  Lifestyle  . Physical activity    Days per week: 7 days    Minutes per session: 150+ min  . Stress: Not at all  Relationships  . Social connections    Talks on phone: More than three times a week    Gets together: More than three times a week    Attends religious service: More than 4 times per year    Active member of club or organization: Yes    Attends meetings of clubs or organizations: More than 4 times per  year    Relationship status: Married  . Intimate partner violence    Fear of current or ex partner: No    Emotionally abused: No    Physically abused: No    Forced sexual activity: No  Other Topics Concern  . Not on file  Social History Narrative  . Not on file     Current Outpatient Medications:  .  amLODipine (NORVASC) 5 MG tablet, Take 1 tablet by mouth once daily, Disp: 90 tablet, Rfl: 0 .  aspirin (ASPIRIN ADULT LOW DOSE) 81 MG EC tablet, Take by mouth., Disp: , Rfl:  .  atorvastatin (LIPITOR) 40 MG tablet, Take 1 tablet by mouth once daily, Disp: 90 tablet, Rfl: 0 .  Blood Glucose Monitoring Suppl (FREESTYLE FREEDOM LITE) w/Device KIT, , Disp: , Rfl:  .  glipiZIDE (GLUCOTROL XL) 5 MG 24 hr tablet, Take 1 tablet by mouth once daily with breakfast, Disp: 90 tablet, Rfl: 0 .  lisinopril-hydrochlorothiazide  (ZESTORETIC) 20-25 MG tablet, Take 1 tablet by mouth once daily, Disp: 90 tablet, Rfl: 0 .  metFORMIN (GLUCOPHAGE-XR) 750 MG 24 hr tablet, TAKE 2 TABLETS BY MOUTH ONCE DAILY WITH BREAKFAST, Disp: 180 tablet, Rfl: 0  Allergies  Allergen Reactions  . Shrimp [Shellfish Allergy] Swelling    I personally reviewed active problem list, medication list, allergies, family history, social history with the patient/caregiver today.   ROS  Constitutional: Negative for fever or weight change.  Respiratory: Negative for cough and shortness of breath.   Cardiovascular: Negative for chest pain or palpitations.  Gastrointestinal: Negative for abdominal pain, no bowel changes.  Musculoskeletal: Negative for gait problem or joint swelling.  Skin: Negative for rash.  Neurological: Negative for dizziness or headache.  No other specific complaints in a complete review of systems (except as listed in HPI above).  Objective  Vitals:   06/06/19 0926  BP: 134/78  Pulse: 89  Resp: 16  Temp: (!) 96.9 F (36.1 C)  TempSrc: Temporal  SpO2: 99%  Weight: 243 lb 6.4 oz (110.4 kg)  Height: '6\' 1"'  (1.854 m)    Body mass index is 32.11 kg/m.  Physical Exam  Constitutional: Patient appears well-developed and well-nourished. Obese  No distress.  HEENT: head atraumatic, normocephalic, pupils equal and reactive to light, neck supple Cardiovascular: Normal rate, regular rhythm and normal heart sounds.  No murmur heard. No BLE edema. Pulmonary/Chest: Effort normal and breath sounds normal. No respiratory distress. Abdominal: Soft.  There is no tenderness. Psychiatric: Patient has a normal mood and affect. behavior is normal. Judgment and thought content normal.  Recent Results (from the past 2160 hour(s))  POCT HgB A1C     Status: Abnormal   Collection Time: 06/06/19  9:30 AM  Result Value Ref Range   Hemoglobin A1C     HbA1c POC (<> result, manual entry)     HbA1c, POC (prediabetic range)     HbA1c, POC  (controlled diabetic range) 7.4 (A) 0.0 - 7.0 %    PHQ2/9: Depression screen [facility] 2/9 06/06/2019 10/24/2018 07/19/2018 06/08/2018 01/18/2017  Decreased Interest 0 0 0 0 0  Down, Depressed, Hopeless 0 0 0 0 0  PHQ - 2 Score 0 0 0 0 0  Altered sleeping 0 0 0 0 -  Tired, decreased energy 1 0 0 0 -  Change in appetite 2 0 0 0 -  Feeling bad or failure about yourself  0 0 0 0 -  Trouble concentrating 0 0 0 0 -  Moving  slowly or fidgety/restless 0 0 0 0 -  Suicidal thoughts 0 0 0 0 -  PHQ-9 Score 3 0 0 0 -  Difficult doing work/chores Not difficult at all Not difficult at all Not difficult at all Not difficult at all -    phq 9 is negative   Fall Risk: Fall Risk  06/06/2019 10/24/2018 07/19/2018 06/08/2018 01/18/2017  Falls in the past year? 0 0 No No No  Number falls in past yr: 0 0 - - -  Injury with Fall? 0 0 - - -  Risk for fall due to : - - - Impaired vision -  Risk for fall due to: Comment - - - wears eyeglasses -     Functional Status Survey: Is the patient deaf or have difficulty hearing?: No Does the patient have difficulty seeing, even when wearing glasses/contacts?: Yes(reading glasses) Does the patient have difficulty concentrating, remembering, or making decisions?: No Does the patient have difficulty walking or climbing stairs?: No Does the patient have difficulty dressing or bathing?: No Does the patient have difficulty doing errands alone such as visiting a doctor's office or shopping?: No    Assessment & Plan   1. Type 2 diabetes mellitus with microalbuminuria, without long-term current use of insulin (HCC)  - POCT HgB A1C - glipiZIDE (GLUCOTROL XL) 5 MG 24 hr tablet; Daily  Dispense: 90 tablet; Refill: 0 - lisinopril-hydrochlorothiazide (ZESTORETIC) 20-25 MG tablet; Take 1 tablet by mouth daily.  Dispense: 90 tablet; Refill: 0 - metFORMIN (GLUCOPHAGE-XR) 750 MG 24 hr tablet; Take 2 tablets (1,500 mg total) by mouth daily with breakfast.  Dispense: 180 tablet; Refill:  0  2. Benign essential HTN  - amLODipine (NORVASC) 5 MG tablet; Take 1 tablet (5 mg total) by mouth daily.  Dispense: 90 tablet; Refill: 0 - lisinopril-hydrochlorothiazide (ZESTORETIC) 20-25 MG tablet; Take 1 tablet by mouth daily.  Dispense: 90 tablet; Refill: 0  3. Dyslipidemia  - atorvastatin (LIPITOR) 40 MG tablet; Take 1 tablet (40 mg total) by mouth daily.  Dispense: 90 tablet; Refill: 0  4. Dyslipidemia associated with type 2 diabetes mellitus (HCC)  - atorvastatin (LIPITOR) 40 MG tablet; Take 1 tablet (40 mg total) by mouth daily.  Dispense: 90 tablet; Refill: 0

## 2019-06-14 ENCOUNTER — Ambulatory Visit: Payer: Commercial Managed Care - HMO

## 2019-06-29 ENCOUNTER — Ambulatory Visit: Payer: Medicare HMO

## 2019-08-01 ENCOUNTER — Telehealth: Payer: Self-pay | Admitting: Family Medicine

## 2019-08-01 NOTE — Chronic Care Management (AMB) (Signed)
°  Chronic Care Management   Outreach Note  08/01/2019 Name: Austin Ortega MRN: [id number] DOB: May 22, 1951  Referred by: Steele Sizer, MD Reason for referral : Chronic Care Management (Third CCM outreach was unsuccessful.  )   Third unsuccessful telephone outreach was attempted today. The patient was referred to the case management team for assistance with chronic care management and care coordination. The patient's primary care provider has been notified of our unsuccessful attempts to make or maintain contact with the patient. The care management team is pleased to engage with this patient at any time in the future should he/she be interested in assistance from the care management team.   Follow Up Plan: The care management team is available to follow up with the patient after provider conversation with the patient regarding recommendation for care management engagement and subsequent re-referral to the care management team.   [place]  ??[email or web address]   ??[phone]

## 2019-11-06 ENCOUNTER — Ambulatory Visit: Payer: Medicare HMO | Admitting: Family Medicine

## 2020-01-14 ENCOUNTER — Telehealth: Payer: Self-pay | Admitting: Family Medicine

## 2020-01-14 ENCOUNTER — Other Ambulatory Visit: Payer: Self-pay | Admitting: Family Medicine

## 2020-01-14 DIAGNOSIS — I10 Essential (primary) hypertension: Secondary | ICD-10-CM

## 2020-01-14 DIAGNOSIS — E1169 Type 2 diabetes mellitus with other specified complication: Secondary | ICD-10-CM

## 2020-01-14 DIAGNOSIS — E785 Hyperlipidemia, unspecified: Secondary | ICD-10-CM

## 2020-01-14 DIAGNOSIS — E1129 Type 2 diabetes mellitus with other diabetic kidney complication: Secondary | ICD-10-CM

## 2020-01-14 NOTE — Telephone Encounter (Signed)
Requested medication (s) are due for refill today: yes  Requested medication (s) are on the active medication list: yes  Last refill:  06/06/19  Future visit scheduled: No  Notes to clinic:  Left VM for patient to return call to office. Needs appointment and labs.    Requested Prescriptions  Pending Prescriptions Disp Refills   amLODipine (NORVASC) 5 MG tablet [Pharmacy Med Name: amLODIPine Besylate 5 MG Oral Tablet] 90 tablet 0    Sig: Take 1 tablet by mouth once daily      Cardiovascular:  Calcium Channel Blockers Failed - 01/14/2020  2:44 PM      Failed - Valid encounter within last 6 months    Recent Outpatient Visits           7 months ago Type 2 diabetes mellitus with microalbuminuria, without long-term current use of insulin [facility])   [facility] Steele Sizer, MD   1 year ago Type 2 diabetes mellitus with microalbuminuria, without long-term current use of insulin [facility])   [facility] Steele Sizer, MD   1 year ago Type 2 diabetes mellitus with microalbuminuria, without long-term current use of insulin [facility])   [facility] Sizer, MD   1 year ago Uncontrolled type 2 diabetes mellitus with hyperglycemia [facility])   [facility] Steele Sizer, MD   2 years ago Type 2 diabetes mellitus with microalbuminuria, without long-term current use of insulin [facility])   [facility] [place], Drue Stager, MD              Passed - Last BP in normal range    BP Readings from Last 1 Encounters:  06/06/19 134/78            atorvastatin (LIPITOR) 40 MG tablet [Pharmacy Med Name: Atorvastatin Calcium 40 MG Oral Tablet] 90 tablet 0    Sig: Take 1 tablet by mouth once daily      Cardiovascular:  Antilipid - Statins Failed - 01/14/2020  2:44 PM      Failed - Total Cholesterol in normal range and within 360 days    Cholesterol, Total  Date Value Ref Range Status  04/20/2016 230 (H) 100 -  199 mg/dL Final   Cholesterol  Date Value Ref Range Status  10/24/2018 141 <200 mg/dL Final          Failed - LDL in normal range and within 360 days    LDL Cholesterol (Calc)  Date Value Ref Range Status  10/24/2018 72 mg/dL (calc) Final    Comment:    Reference range: <100 . Desirable range <100 mg/dL for primary prevention;   <70 mg/dL for patients with CHD or diabetic patients  with > or = 2 CHD risk factors. Marland Kitchen LDL-C is now calculated using the Martin-Hopkins  calculation, which is a validated novel method providing  better accuracy than the Friedewald equation in the  estimation of LDL-C.  Cresenciano Genre et al. Annamaria Helling. 9476;546(50): 2061-2068  (http://education.[employer].com/faq/FAQ164)           Failed - HDL in normal range and within 360 days    HDL  Date Value Ref Range Status  10/24/2018 55 >40 mg/dL Final  04/20/2016 54 >39 mg/dL Final          Failed - Triglycerides in normal range and within 360 days    Triglycerides  Date Value Ref Range Status  10/24/2018 67 <150 mg/dL Final  Passed - Patient is not pregnant      Passed - Valid encounter within last 12 months    Recent Outpatient Visits           7 months ago Type 2 diabetes mellitus with microalbuminuria, without long-term current use of insulin ([place])   [facility] Steele Sizer, MD   1 year ago Type 2 diabetes mellitus with microalbuminuria, without long-term current use of insulin [facility])   [facility] Sizer, MD   1 year ago Type 2 diabetes mellitus with microalbuminuria, without long-term current use of insulin [facility])   [facility] Steele Sizer, MD   1 year ago Uncontrolled type 2 diabetes mellitus with hyperglycemia [facility])   [facility] Steele Sizer, MD   2 years ago Type 2 diabetes mellitus with microalbuminuria, without long-term current use of insulin ([place])   [facility], Drue Stager, MD                glipiZIDE (GLUCOTROL XL) 5 MG 24 hr tablet [Pharmacy Med Name: glipiZIDE ER 5 MG Oral Tablet Extended Release 24 Hour] 90 tablet 0    Sig: Take 1 tablet by mouth once daily      Endocrinology:  Diabetes - Sulfonylureas Failed - 01/14/2020  2:44 PM      Failed - HBA1C is between 0 and 7.9 and within 180 days    Hemoglobin A1C  Date Value Ref Range Status  04/10/2013 7.2  Final   HbA1c, POC (controlled diabetic range)  Date Value Ref Range Status  06/06/2019 7.4 (A) 0.0 - 7.0 % Final          Failed - Valid encounter within last 6 months    Recent Outpatient Visits           7 months ago Type 2 diabetes mellitus with microalbuminuria, without long-term current use of insulin [facility])   [facility] Sizer, MD   1 year ago Type 2 diabetes mellitus with microalbuminuria, without long-term current use of insulin [facility])   [facility] Steele Sizer, MD   1 year ago Type 2 diabetes mellitus with microalbuminuria, without long-term current use of insulin [facility])   [facility] Steele Sizer, MD   1 year ago Uncontrolled type 2 diabetes mellitus with hyperglycemia [facility])   [facility] Sizer, MD   2 years ago Type 2 diabetes mellitus with microalbuminuria, without long-term current use of insulin [facility])   [facility] [place], Drue Stager, MD                lisinopril-hydrochlorothiazide (ZESTORETIC) 20-25 MG tablet [Pharmacy Med Name: Lisinopril-hydroCHLOROthiazide 20-25 MG Oral Tablet] 90 tablet 0    Sig: Take 1 tablet by mouth once daily      Cardiovascular:  ACEI + Diuretic Combos Failed - 01/14/2020  2:44 PM      Failed - Na in normal range and within 180 days    Sodium  Date Value Ref Range Status  10/24/2018 140 135 - 146 mmol/L Final  04/20/2016 138 134 - 144 mmol/L Final          Failed - K in normal range and within  180 days    Potassium  Date Value Ref Range Status  10/24/2018 4.6 3.5 - 5.3 mmol/L Final          Failed - Cr in normal range and  within 180 days    Creat  Date Value Ref Range Status  10/24/2018 1.33 (H) 0.70 - 1.25 mg/dL Final    Comment:    For patients >7 years of age, the reference limit for Creatinine is approximately 13% higher for people identified as African-American. .    Creatinine, Urine  Date Value Ref Range Status  10/24/2018 134 20 - 320 mg/dL Final          Failed - Ca in normal range and within 180 days    Calcium  Date Value Ref Range Status  10/24/2018 9.5 8.6 - 10.3 mg/dL Final          Failed - Valid encounter within last 6 months    Recent Outpatient Visits           7 months ago Type 2 diabetes mellitus with microalbuminuria, without long-term current use of insulin [facility])   [facility] [place], Drue Stager, MD   1 year ago Type 2 diabetes mellitus with microalbuminuria, without long-term current use of insulin [facility])   [facility] [place], Drue Stager, MD   1 year ago Type 2 diabetes mellitus with microalbuminuria, without long-term current use of insulin [facility])   [facility] [place], Drue Stager, MD   1 year ago Uncontrolled type 2 diabetes mellitus with hyperglycemia [facility])   [facility] [place], Drue Stager, MD   2 years ago Type 2 diabetes mellitus with microalbuminuria, without long-term current use of insulin [facility])   [facility] [place], Drue Stager, MD              Passed - Patient is not pregnant      Passed - Last BP in normal range    BP Readings from Last 1 Encounters:  06/06/19 134/78            metFORMIN (GLUCOPHAGE-XR) 750 MG 24 hr tablet [Pharmacy Med Name: metFORMIN HCl ER 750 MG Oral Tablet Extended Release 24 Hour] 180 tablet 0    Sig: TAKE 2 TABLETS BY MOUTH ONCE DAILY WITH BREAKFAST      Endocrinology:  Diabetes - Biguanides Failed -  01/14/2020  2:44 PM      Failed - Cr in normal range and within 360 days    Creat  Date Value Ref Range Status  10/24/2018 1.33 (H) 0.70 - 1.25 mg/dL Final    Comment:    For patients >43 years of age, the reference limit for Creatinine is approximately 13% higher for people identified as African-American. .    Creatinine, Urine  Date Value Ref Range Status  10/24/2018 134 20 - 320 mg/dL Final          Failed - HBA1C is between 0 and 7.9 and within 180 days    Hemoglobin A1C  Date Value Ref Range Status  04/10/2013 7.2  Final   HbA1c, POC (controlled diabetic range)  Date Value Ref Range Status  06/06/2019 7.4 (A) 0.0 - 7.0 % Final          Failed - eGFR in normal range and within 360 days    GFR, Est African American  Date Value Ref Range Status  10/24/2018 64 > OR = 60 mL/min/1.7m Final   GFR, Est Non African American  Date Value Ref Range Status  10/24/2018 55 (L) > OR = 60 mL/min/1.765mFinal          Failed - Valid encounter within last 6 months    Recent  Outpatient Visits           7 months ago Type 2 diabetes mellitus with microalbuminuria, without long-term current use of insulin [facility])   [facility] [place], Drue Stager, MD   1 year ago Type 2 diabetes mellitus with microalbuminuria, without long-term current use of insulin [facility])   [facility] [place], Drue Stager, MD   1 year ago Type 2 diabetes mellitus with microalbuminuria, without long-term current use of insulin [facility])   [facility] [place], Drue Stager, MD   1 year ago Uncontrolled type 2 diabetes mellitus with hyperglycemia [facility])   [facility] Steele Sizer, MD   2 years ago Type 2 diabetes mellitus with microalbuminuria, without long-term current use of insulin [facility])   [facility] Steele Sizer, MD

## 2020-01-14 NOTE — Telephone Encounter (Signed)
Attempted to contact patient. Left VM for patient to return call to office. Patient has not been in seen over 6 months. Needs medication check and labs.

## 2020-01-15 NOTE — Telephone Encounter (Signed)
lvm for pt to return call to schedule appt.

## 2020-01-18 ENCOUNTER — Ambulatory Visit (INDEPENDENT_AMBULATORY_CARE_PROVIDER_SITE_OTHER): Payer: Medicare HMO | Admitting: Family Medicine

## 2020-01-18 ENCOUNTER — Encounter: Payer: Self-pay | Admitting: Family Medicine

## 2020-01-18 DIAGNOSIS — E1129 Type 2 diabetes mellitus with other diabetic kidney complication: Secondary | ICD-10-CM

## 2020-01-18 DIAGNOSIS — R809 Proteinuria, unspecified: Secondary | ICD-10-CM

## 2020-01-18 DIAGNOSIS — E1169 Type 2 diabetes mellitus with other specified complication: Secondary | ICD-10-CM | POA: Diagnosis not present

## 2020-01-18 DIAGNOSIS — E785 Hyperlipidemia, unspecified: Secondary | ICD-10-CM

## 2020-01-18 DIAGNOSIS — I10 Essential (primary) hypertension: Secondary | ICD-10-CM

## 2020-01-18 MED ORDER — METFORMIN HCL ER 750 MG PO TB24: 1500.0000 mg | ORAL_TABLET | Freq: Every day | ORAL | 0 refills | Status: DC

## 2020-01-18 MED ORDER — AMLODIPINE BESYLATE 5 MG PO TABS: 5.0000 mg | ORAL_TABLET | Freq: Every day | ORAL | 0 refills | Status: DC

## 2020-01-18 MED ORDER — GLIPIZIDE ER 5 MG PO TB24: ORAL_TABLET | ORAL | 0 refills | Status: DC

## 2020-01-18 MED ORDER — LISINOPRIL-HYDROCHLOROTHIAZIDE 20-25 MG PO TABS: 1.0000 | ORAL_TABLET | Freq: Every day | ORAL | 0 refills | Status: DC

## 2020-01-18 MED ORDER — ATORVASTATIN CALCIUM 40 MG PO TABS: 40.0000 mg | ORAL_TABLET | Freq: Every day | ORAL | 0 refills | Status: DC

## 2020-01-18 NOTE — Progress Notes (Signed)
Name: Austin Ortega   MRN: [id number]    DOB: 09-Nov-1951   Date:01/18/2020       Progress Note  Subjective  Chief Complaint  Chief Complaint  Patient presents with  . Medication Refill    needs all of his medications refilled.    I connected with  [name] on 01/18/20 at 11:00 AM EST by telephone and verified that I am speaking with the correct person using two identifiers.  I discussed the limitations, risks, security and privacy concerns of performing an evaluation and management service by telephone and the availability of in person appointments. Staff also discussed with the patient that there may be a patient responsible charge related to this service. Patient Location: at the [facility] with his brother  Provider Location: [facility]  HPI  DMII:hgbA1Cfrom July 2020 was at goal, he checks glucose at home and glucose has been in the 130's. He has been taking Metformin and Glipizide. He has been eating fast food a couple of times a week, not as compliant with his diet .  Denies hypoglycemic episodes. Healso had 100 of microalbumin, he is on ACE .He denies polyphagia, polydipsia, nocturia at most twice per night. He will stop by today to have labs done   HTN:bp at home was 140/80 when he skipped his medication last week, but otherwise normal. No chest pain, palpitation or SOB. Compliant with medication now  Hyperlipidemia:he is taking statin therapy, no side effects, last LDL was at goal , but he is due for labs today and will return this afternoon to have it done   Patient Active Problem List   Diagnosis Date Noted  . Non compliance with medical treatment 06/08/2018  . Umbilical hernia without obstruction and without gangrene 04/21/2016  . Vitiligo 04/21/2016  . Decreased libido 03/04/2009  . Pure hypercholesterolemia 10/01/2008  . Benign essential HTN 08/27/2008  . Type 2 diabetes mellitus with microalbuminuria ([place]) 08/27/2008    History reviewed. No  pertinent surgical history.  Family History  Problem Relation Age of Onset  . Diabetes Mother   . Hypertension Mother   . Cancer Father        Lung  . Hypertension Sister   . Diabetes Sister   . Hypertension Brother   . Hypertension Brother   . Heart attack Brother    Social History   Tobacco Use  . Smoking status: Former Smoker    Packs/day: 0.50    Years: 3.00    Pack years: 1.50    Types: Cigarettes    Start date: 04/20/1978    Quit date: 04/20/1981    Years since quitting: 38.7  . Smokeless tobacco: Never Used  . Tobacco comment: more than 40 years ago  Substance Use Topics  . Alcohol use: No    Alcohol/week: 0.0 standard drinks     Current Outpatient Medications:  .  amLODipine (NORVASC) 5 MG tablet, Take 1 tablet (5 mg total) by mouth daily., Disp: 90 tablet, Rfl: 0 .  aspirin (ASPIRIN ADULT LOW DOSE) 81 MG EC tablet, Take by mouth., Disp: , Rfl:  .  atorvastatin (LIPITOR) 40 MG tablet, Take 1 tablet (40 mg total) by mouth daily., Disp: 90 tablet, Rfl: 0 .  Blood Glucose Monitoring Suppl (FREESTYLE FREEDOM LITE) w/Device KIT, , Disp: , Rfl:  .  glipiZIDE (GLUCOTROL XL) 5 MG 24 hr tablet, Daily, Disp: 90 tablet, Rfl: 0 .  lisinopril-hydrochlorothiazide (ZESTORETIC) 20-25 MG tablet, Take 1 tablet by mouth daily., Disp: 90  tablet, Rfl: 0 .  metFORMIN (GLUCOPHAGE-XR) 750 MG 24 hr tablet, Take 2 tablets (1,500 mg total) by mouth daily with breakfast., Disp: 180 tablet, Rfl: 0  Allergies  Allergen Reactions  . Shrimp [Shellfish Allergy] Swelling    I personally reviewed active problem list, medication list, allergies, family history, social history, health maintenance with the patient/caregiver today.   ROS  Constitutional: Negative for fever or weight change.  Respiratory: Negative for cough and shortness of breath.   Cardiovascular: Negative for chest pain or palpitations.  Gastrointestinal: Negative for abdominal pain, no bowel changes.  Musculoskeletal:  Negative for gait problem or joint swelling.  Skin: Negative for rash.  Neurological: Negative for dizziness or headache.  No other specific complaints in a complete review of systems (except as listed in HPI above).  Objective  Virtual encounter, vitals obtained by CMA when he came by for labs  Today's Vitals   01/18/20 1601  BP: 118/74  Pulse: 82  Resp: 18  Temp: 97.8 F (36.6 C)  TempSrc: Temporal  SpO2: 93%  Weight: 248 lb (112.5 kg)  Height: '6\' 1"'  (1.854 m)   Body mass index is 32.72 kg/m.  Physical Exam  Awake, alert and oriented   PHQ2/9: Depression screen [facility] 2/9 01/18/2020 06/06/2019 10/24/2018 07/19/2018 06/08/2018  Decreased Interest 0 0 0 0 0  Down, Depressed, Hopeless 0 0 0 0 0  PHQ - 2 Score 0 0 0 0 0  Altered sleeping 0 0 0 0 0  Tired, decreased energy 0 1 0 0 0  Change in appetite 0 2 0 0 0  Feeling bad or failure about yourself  0 0 0 0 0  Trouble concentrating 0 0 0 0 0  Moving slowly or fidgety/restless 0 0 0 0 0  Suicidal thoughts 0 0 0 0 0  PHQ-9 Score 0 3 0 0 0  Difficult doing work/chores - Not difficult at all Not difficult at all Not difficult at all Not difficult at all   PHQ-2/9 Result is negative.    Fall Risk: Fall Risk  01/18/2020 06/06/2019 10/24/2018 07/19/2018 06/08/2018  Falls in the past year? 0 0 0 No No  Number falls in past yr: 0 0 0 - -  Injury with Fall? 0 0 0 - -  Risk for fall due to : - - - - Impaired vision  Risk for fall due to: Comment - - - - wears eyeglasses     Assessment & Plan  1. Benign essential HTN  - COMPLETE METABOLIC PANEL WITH GFR - CBC with Differential/Platelet  2. Dyslipidemia  - Lipid panel - Hemoglobin A1c  3. Dyslipidemia associated with type 2 diabetes mellitus (HCC)  - Lipid panel - Microalbumin / creatinine urine ratio - Hemoglobin A1c  4. Type 2 diabetes mellitus with microalbuminuria, without long-term current use of insulin ([place])  Recheck labs   I discussed the assessment and  treatment plan with the patient. The patient was provided an opportunity to ask questions and all were answered. The patient agreed with the plan and demonstrated an understanding of the instructions.   The patient was advised to call back or seek an in-person evaluation if the symptoms worsen or if the condition fails to improve as anticipated.  I provided 25  minutes of non-face-to-face time during this encounter.  Loistine Chance, MD

## 2020-01-19 LAB — COMPLETE METABOLIC PANEL WITH GFR
AG Ratio: 1.5 (calc) (ref 1.0–2.5)
ALT: 15 U/L (ref 9–46)
AST: 13 U/L (ref 10–35)
Albumin: 4.1 g/dL (ref 3.6–5.1)
Alkaline phosphatase (APISO): 103 U/L (ref 35–144)
BUN/Creatinine Ratio: 13 (calc) (ref 6–22)
BUN: 24 mg/dL (ref 7–25)
CO2: 28 mmol/L (ref 20–32)
Calcium: 9.5 mg/dL (ref 8.6–10.3)
Chloride: 102 mmol/L (ref 98–110)
Creat: 1.84 mg/dL — ABNORMAL HIGH (ref 0.70–1.25)
GFR, Est African American: 42 mL/min/{1.73_m2} — ABNORMAL LOW (ref >=60)
GFR, Est Non African American: 37 mL/min/{1.73_m2} — ABNORMAL LOW (ref >=60)
Globulin: 2.7 g/dL (calc) (ref 1.9–3.7)
Glucose, Bld: 250 mg/dL — ABNORMAL HIGH (ref 65–99)
Potassium: 4.5 mmol/L (ref 3.5–5.3)
Sodium: 137 mmol/L (ref 135–146)
Total Bilirubin: 1 mg/dL (ref 0.2–1.2)
Total Protein: 6.8 g/dL (ref 6.1–8.1)

## 2020-01-19 LAB — LIPID PANEL
Cholesterol: 160 mg/dL (ref <200)
HDL: 47 mg/dL (ref >=40)
LDL Cholesterol (Calc): 88 mg/dL (calc)
Non-HDL Cholesterol (Calc): 113 mg/dL (calc) (ref <130)
Total CHOL/HDL Ratio: 3.4 (calc) (ref <5.0)
Triglycerides: 146 mg/dL (ref <150)

## 2020-01-19 LAB — CBC WITH DIFFERENTIAL/PLATELET
Absolute Monocytes: 539 cells/uL (ref 200–950)
Basophils Absolute: 56 cells/uL (ref 0–200)
Basophils Relative: 0.6 %
Eosinophils Absolute: 251 cells/uL (ref 15–500)
Eosinophils Relative: 2.7 %
HCT: 39.2 % (ref 38.5–50.0)
Hemoglobin: 12.6 g/dL — ABNORMAL LOW (ref 13.2–17.1)
Lymphs Abs: 2176 cells/uL (ref 850–3900)
MCH: 28.2 pg (ref 27.0–33.0)
MCHC: 32.1 g/dL (ref 32.0–36.0)
MCV: 87.7 fL (ref 80.0–100.0)
MPV: 10.4 fL (ref 7.5–12.5)
Monocytes Relative: 5.8 %
Neutro Abs: 6278 cells/uL (ref 1500–7800)
Neutrophils Relative %: 67.5 %
Platelets: 232 10*3/uL (ref 140–400)
RBC: 4.47 10*6/uL (ref 4.20–5.80)
RDW: 12.5 % (ref 11.0–15.0)
Total Lymphocyte: 23.4 %
WBC: 9.3 10*3/uL (ref 3.8–10.8)

## 2020-01-19 LAB — HEMOGLOBIN A1C
Hgb A1c MFr Bld: 13.6 % of total Hgb — ABNORMAL HIGH (ref <5.7)
Mean Plasma Glucose: 344 (calc)
eAG (mmol/L): 19 (calc)

## 2020-01-19 LAB — MICROALBUMIN / CREATININE URINE RATIO
Creatinine, Urine: 538 mg/dL — ABNORMAL HIGH (ref 20–320)
Microalb Creat Ratio: 9 mcg/mg creat (ref <30)
Microalb, Ur: 4.7 mg/dL

## 2020-01-21 ENCOUNTER — Telehealth: Payer: Self-pay | Admitting: Family Medicine

## 2020-01-21 NOTE — Telephone Encounter (Signed)
-----   Message from [place], [place] sent at 01/21/2020 10:33 AM EST ----- Patient notified of lab results by phone and states he has missed some dosages of his cholesterol medication. Patient was transferred up front to [facility] to schedule a sooner follow up for his DM, Cholesterol being out of range.

## 2020-01-21 NOTE — Telephone Encounter (Signed)
Pt is scheduled for 01/30/2020 ° °

## 2020-01-30 ENCOUNTER — Encounter: Payer: Self-pay | Admitting: Family Medicine

## 2020-01-30 ENCOUNTER — Ambulatory Visit (INDEPENDENT_AMBULATORY_CARE_PROVIDER_SITE_OTHER): Payer: Medicare HMO | Admitting: Family Medicine

## 2020-01-30 ENCOUNTER — Other Ambulatory Visit: Payer: Self-pay

## 2020-01-30 VITALS — BP 128/72 | HR 104 | Temp 96.9°F | Resp 18 | Ht 73.0 in | Wt 241.5 lb

## 2020-01-30 DIAGNOSIS — E1165 Type 2 diabetes mellitus with hyperglycemia: Secondary | ICD-10-CM

## 2020-01-30 DIAGNOSIS — D649 Anemia, unspecified: Secondary | ICD-10-CM | POA: Diagnosis not present

## 2020-01-30 DIAGNOSIS — N1832 Chronic kidney disease, stage 3b: Secondary | ICD-10-CM

## 2020-01-30 DIAGNOSIS — Z1211 Encounter for screening for malignant neoplasm of colon: Secondary | ICD-10-CM | POA: Diagnosis not present

## 2020-01-30 LAB — GLUCOSE, POCT (MANUAL RESULT ENTRY): POC Glucose: 116 mg/dl — AB (ref 70–99)

## 2020-01-30 NOTE — Progress Notes (Signed)
Name: Austin Ortega   MRN: [id number]    DOB: March 06, 1951   Date:01/30/2020       Progress Note  Subjective  Chief Complaint  Chief Complaint  Patient presents with  . Diabetes    HPI  Diabetes: he was seen two weeks ago and A1C went from 7.4% July 2020 to 13.6%, he states since he received the results he has resumed a diabetic diet. Avoiding sodas and sweet tea, switched to wheat bread, his glucose fasting this morning was 127. He denies polyphagia, polydipsia or polyuria.   Mild anemia: discussed repeating next visit, it may be secondary to iron deficiency or renal disease, he is willing to have colonoscopy   CKI: it may be secondary to uncontrolled diabetes, he has a good urine output, no pruritis, he denies taking nsaid's We will recheck in 3 months    Patient Active Problem List   Diagnosis Date Noted  . Non compliance with medical treatment 06/08/2018  . Umbilical hernia without obstruction and without gangrene 04/21/2016  . Vitiligo 04/21/2016  . Decreased libido 03/04/2009  . Pure hypercholesterolemia 10/01/2008  . Benign essential HTN 08/27/2008  . Type 2 diabetes mellitus with microalbuminuria ([place]) 08/27/2008    History reviewed. No pertinent surgical history.  Family History  Problem Relation Age of Onset  . Diabetes Mother   . Hypertension Mother   . Cancer Father        Lung  . Hypertension Sister   . Diabetes Sister   . Hypertension Brother   . Hypertension Brother   . Heart attack Brother     Social History   Tobacco Use  . Smoking status: Former Smoker    Packs/day: 0.50    Years: 3.00    Pack years: 1.50    Types: Cigarettes    Start date: 04/20/1978    Quit date: 04/20/1981    Years since quitting: 38.8  . Smokeless tobacco: Never Used  . Tobacco comment: more than 40 years ago  Substance Use Topics  . Alcohol use: No    Alcohol/week: 0.0 standard drinks     Current Outpatient Medications:  .  amLODipine (NORVASC) 5 MG tablet, Take 1  tablet (5 mg total) by mouth daily., Disp: 90 tablet, Rfl: 0 .  aspirin (ASPIRIN ADULT LOW DOSE) 81 MG EC tablet, Take by mouth., Disp: , Rfl:  .  atorvastatin (LIPITOR) 40 MG tablet, Take 1 tablet (40 mg total) by mouth daily., Disp: 90 tablet, Rfl: 0 .  Blood Glucose Monitoring Suppl (FREESTYLE FREEDOM LITE) w/Device KIT, , Disp: , Rfl:  .  glipiZIDE (GLUCOTROL XL) 5 MG 24 hr tablet, Daily, Disp: 90 tablet, Rfl: 0 .  lisinopril-hydrochlorothiazide (ZESTORETIC) 20-25 MG tablet, Take 1 tablet by mouth daily., Disp: 90 tablet, Rfl: 0 .  metFORMIN (GLUCOPHAGE-XR) 750 MG 24 hr tablet, Take 2 tablets (1,500 mg total) by mouth daily with breakfast., Disp: 180 tablet, Rfl: 0  Allergies  Allergen Reactions  . Shrimp [Shellfish Allergy] Swelling    I personally reviewed active problem list, medication list, allergies, family history, social history, health maintenance with the patient/caregiver today.   ROS  Constitutional: Negative for fever or weight change.  Respiratory: Negative for cough and shortness of breath.   Cardiovascular: Negative for chest pain or palpitations.  Gastrointestinal: Negative for abdominal pain, no bowel changes.  Musculoskeletal: Negative for gait problem or joint swelling.  Skin: Negative for rash.  Neurological: Negative for dizziness or headache.  No other specific complaints in  a complete review of systems (except as listed in HPI above).  Objective   Vitals:   01/30/20 1552  BP: 128/72  Pulse: (!) 104  Resp: 18  Temp: (!) 96.9 F (36.1 C)  TempSrc: Temporal  SpO2: 98%  Weight: 241 lb 8 oz (109.5 kg)  Height: '6\' 1"'  (1.854 m)    Body mass index is 31.86 kg/m.  Physical Exam  Constitutional: Patient appears well-developed and well-nourished. Obese No distress.  HEENT: head atraumatic, normocephalic, pupils equal and reactive to light, Cardiovascular: Normal rate, regular rhythm and normal heart sounds.  No murmur heard. No BLE  edema. Pulmonary/Chest: Effort normal and breath sounds normal. No respiratory distress. Abdominal: Soft.  There is no tenderness. Psychiatric: Patient has a normal mood and affect. behavior is normal. Judgment and thought content normal.  Recent Results (from the past 2160 hour(s))  Lipid panel     Status: None   Collection Time: 01/18/20  4:02 PM  Result Value Ref Range   Cholesterol 160 <200 mg/dL   HDL 47 > OR = 40 mg/dL   Triglycerides 146 <150 mg/dL   LDL Cholesterol (Calc) 88 mg/dL (calc)    Comment: Reference range: <100 . Desirable range <100 mg/dL for primary prevention;   <70 mg/dL for patients with CHD or diabetic patients  with > or = 2 CHD risk factors. Marland Kitchen LDL-C is now calculated using the Martin-Hopkins  calculation, which is a validated novel method providing  better accuracy than the Friedewald equation in the  estimation of LDL-C.  Cresenciano Genre et al. Annamaria Helling. 7829;562(13): 2061-2068  (http://education.[facility].com/faq/FAQ164)    Total CHOL/HDL Ratio 3.4 <5.0 (calc)   Non-HDL Cholesterol (Calc) 113 <130 mg/dL (calc)    Comment: For patients with diabetes plus 1 major ASCVD risk  factor, treating to a non-HDL-C goal of <100 mg/dL  (LDL-C of <70 mg/dL) is considered a therapeutic  option.   Microalbumin / creatinine urine ratio     Status: Abnormal   Collection Time: 01/18/20  4:02 PM  Result Value Ref Range   Creatinine, Urine 538 (H) 20 - 320 mg/dL    Comment: Verified by repeat analysis. .    Microalb, Ur 4.7 mg/dL    Comment: Reference Range Not established    Microalb Creat Ratio 9 <30 mcg/mg creat    Comment: . The ADA defines abnormalities in albumin excretion as follows: Marland Kitchen Category         Result (mcg/mg creatinine) . Normal                    <30 Microalbuminuria         30-299  Clinical albuminuria   > OR = 300 . The ADA recommends that at least two of three specimens collected within a 3-6 month period be abnormal before considering  a patient to be within a diagnostic category.   COMPLETE METABOLIC PANEL WITH GFR     Status: Abnormal   Collection Time: 01/18/20  4:02 PM  Result Value Ref Range   Glucose, Bld 250 (H) 65 - 99 mg/dL    Comment: .            Fasting reference interval . For someone without known diabetes, a glucose value >125 mg/dL indicates that they may have diabetes and this should be confirmed with a follow-up test. .    BUN 24 7 - 25 mg/dL   Creat 1.84 (H) 0.70 - 1.25 mg/dL    Comment: For patients >  69 years of age, the reference limit for Creatinine is approximately 13% higher for people identified as African-American. .    GFR, Est Non African American 37 (L) > OR = 60 mL/min/1.60m   GFR, Est African American 42 (L) > OR = 60 mL/min/1.778m  BUN/Creatinine Ratio 13 6 - 22 (calc)   Sodium 137 135 - 146 mmol/L   Potassium 4.5 3.5 - 5.3 mmol/L   Chloride 102 98 - 110 mmol/L   CO2 28 20 - 32 mmol/L   Calcium 9.5 8.6 - 10.3 mg/dL   Total Protein 6.8 6.1 - 8.1 g/dL   Albumin 4.1 3.6 - 5.1 g/dL   Globulin 2.7 1.9 - 3.7 g/dL (calc)   AG Ratio 1.5 1.0 - 2.5 (calc)   Total Bilirubin 1.0 0.2 - 1.2 mg/dL   Alkaline phosphatase (APISO) 103 35 - 144 U/L   AST 13 10 - 35 U/L   ALT 15 9 - 46 U/L  CBC with Differential/Platelet     Status: Abnormal   Collection Time: 01/18/20  4:02 PM  Result Value Ref Range   WBC 9.3 3.8 - 10.8 Thousand/uL   RBC 4.47 4.20 - 5.80 Million/uL   Hemoglobin 12.6 (L) 13.2 - 17.1 g/dL   HCT 39.2 38.5 - 50.0 %   MCV 87.7 80.0 - 100.0 fL   MCH 28.2 27.0 - 33.0 pg   MCHC 32.1 32.0 - 36.0 g/dL   RDW 12.5 11.0 - 15.0 %   Platelets 232 140 - 400 Thousand/uL   MPV 10.4 7.5 - 12.5 fL   Neutro Abs 6,278 1,500 - 7,800 cells/uL   Lymphs Abs 2,176 850 - 3,900 cells/uL   Absolute Monocytes 539 200 - 950 cells/uL   Eosinophils Absolute 251 15 - 500 cells/uL   Basophils Absolute 56 0 - 200 cells/uL   Neutrophils Relative % 67.5 %   Total Lymphocyte 23.4 %   Monocytes  Relative 5.8 %   Eosinophils Relative 2.7 %   Basophils Relative 0.6 %  Hemoglobin A1c     Status: Abnormal   Collection Time: 01/18/20  4:02 PM  Result Value Ref Range   Hgb A1c MFr Bld 13.6 (H) <5.7 % of total Hgb    Comment: For someone without known diabetes, a hemoglobin A1c value of 6.5% or greater indicates that they may have  diabetes and this should be confirmed with a follow-up  test. . For someone with known diabetes, a value <7% indicates  that their diabetes is well controlled and a value  greater than or equal to 7% indicates suboptimal  control. A1c targets should be individualized based on  duration of diabetes, age, comorbid conditions, and  other considerations. . Currently, no consensus exists regarding use of hemoglobin A1c for diagnosis of diabetes for children. .    Mean Plasma Glucose 344 (calc)   eAG (mmol/L) 19.0 (calc)    Diabetic Foot Exam: Diabetic Foot Exam - Simple   Simple Foot Form Diabetic Foot exam was performed with the following findings: Yes 01/30/2020  4:17 PM  Visual Inspection No deformities, no ulcerations, no other skin breakdown bilaterally: Yes Sensation Testing Intact to touch and monofilament testing bilaterally: Yes Pulse Check Posterior Tibialis and Dorsalis pulse intact bilaterally: Yes Comments      PHQ2/9: [facility] 01/30/2020 01/18/2020 06/06/2019 10/24/2018 07/19/2018  Decreased Interest 0 0 0 0 0  Down, Depressed, Hopeless 0 0 0 0 0  PHQ - 2 Score 0 0 0  0 0  Altered sleeping 0 0 0 0 0  Tired, decreased energy 0 0 1 0 0  Change in appetite 0 0 2 0 0  Feeling bad or failure about yourself  0 0 0 0 0  Trouble concentrating 0 0 0 0 0  Moving slowly or fidgety/restless 0 0 0 0 0  Suicidal thoughts 0 0 0 0 0  PHQ-9 Score 0 0 3 0 0  Difficult doing work/chores - - Not difficult at all Not difficult at all Not difficult at all    phq 9 is negative   Fall Risk: Fall Risk  01/30/2020 01/18/2020 06/06/2019  10/24/2018 07/19/2018  Falls in the past year? 0 0 0 0 No  Number falls in past yr: 0 0 0 0 -  Injury with Fall? 0 0 0 0 -  Risk for fall due to : - - - - -  Risk for fall due to: Comment - - - - -    Functional Status Survey: Is the patient deaf or have difficulty hearing?: No Does the patient have difficulty seeing, even when wearing glasses/contacts?: No Does the patient have difficulty concentrating, remembering, or making decisions?: No Does the patient have difficulty walking or climbing stairs?: No Does the patient have difficulty dressing or bathing?: No Does the patient have difficulty doing errands alone such as visiting a doctor's office or shopping?: No    Assessment & Plan  1. Uncontrolled type 2 diabetes mellitus with hyperglycemia (HCC)  - POCT Glucose (CBG)  2. Anemia, unspecified type   3. Chronic kidney disease, stage 3b  Recheck labs   4. Colon cancer screening  - Ambulatory referral to Gastroenterology

## 2020-02-01 ENCOUNTER — Other Ambulatory Visit: Payer: Self-pay

## 2020-02-01 ENCOUNTER — Telehealth: Payer: Self-pay

## 2020-02-01 DIAGNOSIS — Z1211 Encounter for screening for malignant neoplasm of colon: Secondary | ICD-10-CM

## 2020-02-01 NOTE — Telephone Encounter (Signed)
Gastroenterology Pre-Procedure Review  Request Date: Tuesday 03/12/20 Requesting Physician: Dr. Vicente Males  PATIENT REVIEW QUESTIONS: The patient responded to the following health history questions as indicated:    1. Are you having any GI issues? no 2. Do you have a personal history of Polyps? no 3. Do you have a family history of Colon Cancer or Polyps? no 4. Diabetes Mellitus? yes (oral meds) 5. Joint replacements in the past 12 months?no 6. Major health problems in the past 3 months?no 7. Any artificial heart valves, MVP, or defibrillator?no    MEDICATIONS & ALLERGIES:    Patient reports the following regarding taking any anticoagulation/antiplatelet therapy:   Plavix, Coumadin, Eliquis, Xarelto, Lovenox, Pradaxa, Brilinta, or Effient? no Aspirin? no  Patient confirms/reports the following medications:  Current Outpatient Medications  Medication Sig Dispense Refill  . amLODipine (NORVASC) 5 MG tablet Take 1 tablet (5 mg total) by mouth daily. 90 tablet 0  . aspirin (ASPIRIN ADULT LOW DOSE) 81 MG EC tablet Take by mouth.    Marland Kitchen atorvastatin (LIPITOR) 40 MG tablet Take 1 tablet (40 mg total) by mouth daily. 90 tablet 0  . Blood Glucose Monitoring Suppl (FREESTYLE FREEDOM LITE) w/Device KIT     . glipiZIDE (GLUCOTROL XL) 5 MG 24 hr tablet Daily 90 tablet 0  . lisinopril-hydrochlorothiazide (ZESTORETIC) 20-25 MG tablet Take 1 tablet by mouth daily. 90 tablet 0  . metFORMIN (GLUCOPHAGE-XR) 750 MG 24 hr tablet Take 2 tablets (1,500 mg total) by mouth daily with breakfast. 180 tablet 0   No current facility-administered medications for this visit.    Patient confirms/reports the following allergies:  Allergies  Allergen Reactions  . Shrimp [Shellfish Allergy] Swelling    No orders of the defined types were placed in this encounter.   AUTHORIZATION INFORMATION Primary Insurance: 1D#: Group #:  Secondary Insurance: 1D#: Group #:  SCHEDULE INFORMATION: Date: Tuesday  03/11/20 Time: Location:[facility]

## 2020-03-07 ENCOUNTER — Other Ambulatory Visit: Admission: RE | Admit: 2020-03-07 | Payer: Medicare HMO | Source: Ambulatory Visit

## 2020-03-10 ENCOUNTER — Other Ambulatory Visit
Admission: RE | Admit: 2020-03-10 | Discharge: 2020-03-10 | Disposition: A | Discharge status: Home / Self Care | Payer: Medicare HMO | Source: Ambulatory Visit | Attending: Gastroenterology | Admitting: Gastroenterology

## 2020-03-10 ENCOUNTER — Other Ambulatory Visit: Payer: Self-pay

## 2020-03-10 ENCOUNTER — Telehealth: Payer: Self-pay

## 2020-03-10 DIAGNOSIS — Z01812 Encounter for preprocedural laboratory examination: Secondary | ICD-10-CM | POA: Insufficient documentation

## 2020-03-10 DIAGNOSIS — Z20822 Contact with and (suspected) exposure to covid-19: Secondary | ICD-10-CM | POA: Insufficient documentation

## 2020-03-10 NOTE — Telephone Encounter (Signed)
Trish in Endoscopy notified me that patient stated that he did not receive his instructions for his colonoscopy scheduled for tomorrow.  However, he had his COVID test this morning.  LVM advising patient that if he has not completed a clear liquid diet-we will need to reschedule tomorrows colonoscopy.  I asked him to please call me back as soon as he can. Trish in Endoscopy has been notified that I LVM for him.  Thanks,  [place], [place]

## 2020-03-10 NOTE — Progress Notes (Signed)
Rescheduled colonoscopy from tomorrow to 03/13/20 because he did not receive his bowel prep instructions.  He will stop by tomorrow morning to pick up a copy.  Thanks,  [place], [place]

## 2020-03-11 LAB — SARS CORONAVIRUS 2 (TAT 6-24 HRS): SARS Coronavirus 2: NEGATIVE

## 2020-03-12 ENCOUNTER — Encounter: Payer: Self-pay | Admitting: Gastroenterology

## 2020-03-13 ENCOUNTER — Encounter: Payer: Self-pay | Admitting: Gastroenterology

## 2020-03-13 ENCOUNTER — Other Ambulatory Visit: Payer: Self-pay

## 2020-03-13 ENCOUNTER — Encounter
Admission: RE | Disposition: A | Discharge status: Home / Self Care | Payer: Self-pay | Source: Home / Self Care | Attending: Gastroenterology

## 2020-03-13 ENCOUNTER — Ambulatory Visit: Payer: Medicare HMO | Admitting: Anesthesiology

## 2020-03-13 ENCOUNTER — Ambulatory Visit
Admission: RE | Admit: 2020-03-13 | Discharge: 2020-03-13 | Disposition: A | Discharge status: Home / Self Care | Payer: Medicare HMO | Attending: Gastroenterology | Admitting: Gastroenterology

## 2020-03-13 DIAGNOSIS — Z87891 Personal history of nicotine dependence: Secondary | ICD-10-CM | POA: Insufficient documentation

## 2020-03-13 DIAGNOSIS — Z8249 Family history of ischemic heart disease and other diseases of the circulatory system: Secondary | ICD-10-CM | POA: Insufficient documentation

## 2020-03-13 DIAGNOSIS — D124 Benign neoplasm of descending colon: Secondary | ICD-10-CM | POA: Diagnosis not present

## 2020-03-13 DIAGNOSIS — Z7982 Long term (current) use of aspirin: Secondary | ICD-10-CM | POA: Diagnosis not present

## 2020-03-13 DIAGNOSIS — K635 Polyp of colon: Secondary | ICD-10-CM | POA: Diagnosis not present

## 2020-03-13 DIAGNOSIS — Z7984 Long term (current) use of oral hypoglycemic drugs: Secondary | ICD-10-CM | POA: Diagnosis not present

## 2020-03-13 DIAGNOSIS — D120 Benign neoplasm of cecum: Secondary | ICD-10-CM | POA: Insufficient documentation

## 2020-03-13 DIAGNOSIS — Z1211 Encounter for screening for malignant neoplasm of colon: Secondary | ICD-10-CM | POA: Diagnosis not present

## 2020-03-13 DIAGNOSIS — E785 Hyperlipidemia, unspecified: Secondary | ICD-10-CM | POA: Diagnosis not present

## 2020-03-13 DIAGNOSIS — D123 Benign neoplasm of transverse colon: Secondary | ICD-10-CM | POA: Diagnosis not present

## 2020-03-13 DIAGNOSIS — D122 Benign neoplasm of ascending colon: Secondary | ICD-10-CM | POA: Insufficient documentation

## 2020-03-13 DIAGNOSIS — Z79899 Other long term (current) drug therapy: Secondary | ICD-10-CM | POA: Insufficient documentation

## 2020-03-13 DIAGNOSIS — E119 Type 2 diabetes mellitus without complications: Secondary | ICD-10-CM | POA: Diagnosis not present

## 2020-03-13 DIAGNOSIS — I10 Essential (primary) hypertension: Secondary | ICD-10-CM | POA: Diagnosis not present

## 2020-03-13 HISTORY — PX: COLONOSCOPY WITH PROPOFOL: SHX5780

## 2020-03-13 LAB — GLUCOSE, CAPILLARY: Glucose-Capillary: 180 mg/dL — ABNORMAL HIGH (ref 70–99)

## 2020-03-13 SURGERY — COLONOSCOPY WITH PROPOFOL
Anesthesia: General

## 2020-03-13 MED ORDER — PROPOFOL 10 MG/ML IV BOLUS
INTRAVENOUS | Status: DC | PRN
  Administered 2020-03-13: 50 mg via INTRAVENOUS
  Administered 2020-03-13: 140 ug/kg/min via INTRAVENOUS

## 2020-03-13 MED ORDER — PHENYLEPHRINE HCL (PRESSORS) 10 MG/ML IV SOLN
INTRAVENOUS | Status: DC | PRN
  Administered 2020-03-13 (×2): 100 ug via INTRAVENOUS

## 2020-03-13 MED ORDER — SODIUM CHLORIDE 0.9 % IV SOLN
INTRAVENOUS | Status: DC
  Administered 2020-03-13: 1000 mL via INTRAVENOUS

## 2020-03-13 MED ORDER — PROPOFOL 500 MG/50ML IV EMUL
INTRAVENOUS | Status: AC
  Filled 2020-03-13: qty 50

## 2020-03-13 NOTE — Op Note (Signed)
[facility] Gastroenterology Patient Name: Austin Ortega Procedure Date: 03/13/2020 11:18 AM MRN: [id number] Account #: [id number] Date of Birth: [birth year] Admit Type: Outpatient Age: 69 Room: [facility] ENDO ROOM 1 Gender: Male Note Status: Finalized Procedure:             Colonoscopy Indications:           Screening for colorectal malignant neoplasm Providers:             Jonathon Bellows MD, MD Referring MD:          Bethena Roys. Sowles, MD (Referring MD) Medicines:             Monitored Anesthesia Care Complications:         No immediate complications. Procedure:             Pre-Anesthesia Assessment:                        - Prior to the procedure, a History and Physical was                         performed, and patient medications, allergies and                         sensitivities were reviewed. The patient's tolerance                         of previous anesthesia was reviewed.                        - The risks and benefits of the procedure and the                         sedation options and risks were discussed with the                         patient. All questions were answered and informed                         consent was obtained.                        - ASA Grade Assessment: II - A patient with mild                         systemic disease.                        After obtaining informed consent, the colonoscope was                         passed under direct vision. Throughout the procedure,                         the patient's blood pressure, pulse, and oxygen                         saturations were monitored continuously. The                         Colonoscope was introduced through the anus  and                         advanced to the the cecum, identified by the                         appendiceal orifice. The colonoscopy was performed                         with ease. The patient tolerated the procedure well.                         The quality  of the bowel preparation was excellent. Findings:      The perianal and digital rectal examinations were normal.      Two sessile polyps were found in the descending colon and cecum. The       polyps were 5 to 7 mm in size. These polyps were removed with a cold       snare. Resection and retrieval were complete. To prevent bleeding after       the polypectomy, two hemostatic clips were successfully placed. There       was no bleeding at the end of the procedure. clipped cecal polyp      Three sessile polyps were found in the ascending colon. The polyps were       4 to 6 mm in size. These polyps were removed with a cold snare.       Resection and retrieval were complete.      A 13 mm polyp was found in the transverse colon. The polyp was       semi-pedunculated. The polyp was removed with a hot snare. Resection and       retrieval were complete.      The exam was otherwise without abnormality on direct and retroflexion       views. Impression:            - Two 5 to 7 mm polyps in the descending colon and in                         the cecum, removed with a cold snare. Resected and                         retrieved. Clips were placed.                        - Three 4 to 6 mm polyps in the ascending colon,                         removed with a cold snare. Resected and retrieved.                        - One 13 mm polyp in the transverse colon, removed                         with a hot snare. Resected and retrieved.                        - The examination was otherwise normal on direct and  retroflexion views. Recommendation:        - Discharge patient to home (with escort).                        - Resume previous diet.                        - Continue present medications.                        - Await pathology results.                        - Repeat colonoscopy in 3 years for surveillance. Procedure Code(s):     --- Professional ---                         [phone], Colonoscopy, flexible; with removal of                         tumor(s), polyp(s), or other lesion(s) by snare                         technique Diagnosis Code(s):     --- Professional ---                        K63.5, Polyp of colon                        Z12.11, Encounter for screening for malignant neoplasm                         of colon CPT copyright 2019 American Medical Association. All rights reserved. The codes documented in this report are preliminary and upon coder review may  be revised to meet current compliance requirements. Jonathon Bellows, MD Jonathon Bellows MD, MD 03/13/2020 11:55:29 AM This report has been signed electronically. Number of Addenda: 0 Note Initiated On: 03/13/2020 11:18 AM Scope Withdrawal Time: 0 hours 15 minutes 28 seconds  Total Procedure Duration: 0 hours 21 minutes 34 seconds  Estimated Blood Loss:  Estimated blood loss: none.      [facility]

## 2020-03-13 NOTE — H&P (Signed)
Jonathon Bellows, MD [address], [place], [place], [place], [address] [address], [place], [place], [place], [address] Phone: [phone]  Fax: [phone]  Primary Care Physician:  Steele Sizer, MD   Pre-Procedure History & Physical: HPI:  Austin Ortega is a 69 y.o. male is here for an colonoscopy.   Past Medical History:  Diagnosis Date  . Decreased libido   . Diabetes mellitus without complication ([place])   . Hyperlipidemia   . Hypertension     Past Surgical History:  Procedure Laterality Date  . NO PAST SURGERIES      Prior to Admission medications   Medication Sig Start Date End Date Taking? Authorizing Provider  lisinopril-hydrochlorothiazide (ZESTORETIC) 20-25 MG tablet Take 1 tablet by mouth daily. 01/18/20  Yes Sowles, Drue Stager, MD  metFORMIN (GLUCOPHAGE-XR) 750 MG 24 hr tablet Take 2 tablets (1,500 mg total) by mouth daily with breakfast. 01/18/20  Yes Sowles, Drue Stager, MD  amLODipine (NORVASC) 5 MG tablet Take 1 tablet (5 mg total) by mouth daily. 01/18/20   Steele Sizer, MD  aspirin (ASPIRIN ADULT LOW DOSE) 81 MG EC tablet Take by mouth. 10/01/14   [provider]  atorvastatin (LIPITOR) 40 MG tablet Take 1 tablet (40 mg total) by mouth daily. 01/18/20   Steele Sizer, MD  Blood Glucose Monitoring Suppl (FREESTYLE FREEDOM LITE) w/Device KIT  12/24/08   [provider]  glipiZIDE (GLUCOTROL XL) 5 MG 24 hr tablet Daily 01/18/20   Steele Sizer, MD    Allergies as of 02/01/2020 - Review Complete 01/30/2020  Allergen Reaction Noted  . Shrimp [shellfish allergy] Swelling 06/27/2015    Family History  Problem Relation Age of Onset  . Diabetes Mother   . Hypertension Mother   . Cancer Father        Lung  . Hypertension Sister   . Diabetes Sister   . Hypertension Brother   . Hypertension Brother   . Heart attack Brother     Social History   Socioeconomic History  . Marital status: Married    Spouse name: Vaughan Basta  . Number of  children: 4  . Years of education: some college  . Highest education level: 12th grade  Occupational History  . Occupation: Retired    Fish farm manager: Garment/textile technologist  Tobacco Use  . Smoking status: Former Smoker    Packs/day: 0.50    Years: 3.00    Pack years: 1.50    Types: Cigarettes    Start date: 04/20/1978    Quit date: 04/20/1981    Years since quitting: 38.9  . Smokeless tobacco: Never Used  . Tobacco comment: more than 40 years ago  Substance and Sexual Activity  . Alcohol use: No    Alcohol/week: 0.0 standard drinks  . Drug use: No  . Sexual activity: Yes    Partners: Female  Other Topics Concern  . Not on file  Social History Narrative  . Not on file   Social Determinants of Health   Financial Resource Strain:   . Difficulty of Paying Living Expenses:   Food Insecurity:   . Worried About Charity fundraiser in the Last Year:   . Arboriculturist in the Last Year:   Transportation Needs:   . Film/video editor (Medical):   Marland Kitchen Lack of Transportation (Non-Medical):   Physical Activity:   . Days of Exercise per Week:   . Minutes of Exercise per Session:   Stress:   . Feeling  of Stress :   Social Connections:   . Frequency of Communication with Friends and Family:   . Frequency of Social Gatherings with Friends and Family:   . Attends Religious Services:   . Active Member of Clubs or Organizations:   . Attends Archivist Meetings:   Marland Kitchen Marital Status:   Intimate Partner Violence:   . Fear of Current or Ex-Partner:   . Emotionally Abused:   Marland Kitchen Physically Abused:   . Sexually Abused:     Review of Systems: See HPI, otherwise negative ROS  Physical Exam: BP (!) 159/91   Pulse 81   Temp (!) 97 F (36.1 C) (Tympanic)   Resp 18   Ht 6' 1.5" (1.867 m)   Wt 110.2 kg   SpO2 100%   BMI 31.63 kg/m  General:   Alert,  pleasant and cooperative in NAD Head:  Normocephalic and atraumatic. Neck:  Supple; no masses or thyromegaly. Lungs:  Clear  throughout to auscultation, normal respiratory effort.    Heart:  +S1, +S2, Regular rate and rhythm, No edema. Abdomen:  Soft, nontender and nondistended. Normal bowel sounds, without guarding, and without rebound.   Neurologic:  Alert and  oriented x4;  grossly normal neurologically.  Impression/Plan: Austin Ortega is here for an colonoscopy to be performed for Screening colonoscopy average risk   Risks, benefits, limitations, and alternatives regarding  colonoscopy have been reviewed with the patient.  Questions have been answered.  All parties agreeable.   Jonathon Bellows, MD  03/13/2020, 11:17 AM

## 2020-03-13 NOTE — Transfer of Care (Signed)
Immediate Anesthesia Transfer of Care Note  Patient: Austin Ortega  Procedure(s) Performed: COLONOSCOPY WITH PROPOFOL (N/A )  Patient Location: PACU  Anesthesia Type:General  Level of Consciousness: drowsy  Airway & Oxygen Therapy: Patient Spontanous Breathing  Post-op Assessment: Report given to RN and Post -op Vital signs reviewed and stable  Post vital signs: Reviewed and stable  Last Vitals:  Vitals Value Taken Time  BP 108/62 03/13/20 1158  Temp 36.6 C 03/13/20 1157  Pulse 68 03/13/20 1159  Resp 13 03/13/20 1159  SpO2 97 % 03/13/20 1159  Vitals shown include unvalidated device data.  Last Pain:  Vitals:   03/13/20 1157  TempSrc: Temporal  PainSc: Asleep         Complications: No apparent anesthesia complications

## 2020-03-13 NOTE — Anesthesia Preprocedure Evaluation (Signed)
Anesthesia Evaluation  Patient identified by MRN, date of birth, ID band Patient awake    Reviewed: Allergy & Precautions, NPO status , Patient's Chart, lab work & pertinent test results  History of Anesthesia Complications Negative for: history of anesthetic complications  Airway Mallampati: II       Dental   Pulmonary neg sleep apnea, neg COPD, Not current smoker, former smoker,           Cardiovascular hypertension, Pt. on medications (-) Past MI and (-) CHF (-) dysrhythmias (-) Valvular Problems/Murmurs     Neuro/Psych neg Seizures    GI/Hepatic Neg liver ROS, neg GERD  ,  Endo/Other  diabetes, Type 2, Oral Hypoglycemic Agents  Renal/GU negative Renal ROS     Musculoskeletal   Abdominal   Peds  Hematology   Anesthesia Other Findings   Reproductive/Obstetrics                             Anesthesia Physical Anesthesia Plan  ASA: III  Anesthesia Plan: General   Post-op Pain Management:    Induction: Intravenous  PONV Risk Score and Plan: 2 and Propofol infusion and TIVA  Airway Management Planned: Nasal Cannula  Additional Equipment:   Intra-op Plan:   Post-operative Plan:   Informed Consent: I have reviewed the patients History and Physical, chart, labs and discussed the procedure including the risks, benefits and alternatives for the proposed anesthesia with the patient or authorized representative who has indicated his/her understanding and acceptance.       Plan Discussed with:   Anesthesia Plan Comments:         Anesthesia Quick Evaluation

## 2020-03-13 NOTE — Anesthesia Postprocedure Evaluation (Signed)
Anesthesia Post Note  Patient: Austin Ortega  Procedure(s) Performed: COLONOSCOPY WITH PROPOFOL (N/A )  Patient location during evaluation: Endoscopy Anesthesia Type: General Level of consciousness: awake and alert Pain management: pain level controlled Vital Signs Assessment: post-procedure vital signs reviewed and stable Respiratory status: spontaneous breathing and respiratory function stable Cardiovascular status: stable Anesthetic complications: no     Last Vitals:  Vitals:   03/13/20 1208 03/13/20 1218  BP: 111/62 (!) 143/78  Pulse: 75 71  Resp: 17 (!) 21  Temp:    SpO2: 100% 100%    Last Pain:  Vitals:   03/13/20 1218  TempSrc:   PainSc: 0-No pain                 Dionel Archey K

## 2020-03-14 ENCOUNTER — Encounter: Payer: Self-pay | Admitting: *Deleted

## 2020-03-14 LAB — SURGICAL PATHOLOGY

## 2020-03-16 ENCOUNTER — Encounter: Payer: Self-pay | Admitting: Gastroenterology

## 2020-03-25 ENCOUNTER — Telehealth: Payer: Self-pay | Admitting: Family Medicine

## 2020-03-25 NOTE — Chronic Care Management (AMB) (Signed)
  Chronic Care Management   Note  03/25/2020 Name: Matin Mattioli MRN: [id number] DOB: [birth year]  Jerauld Bostwick is a 69 y.o. year old male who is a primary care patient of Steele Sizer, MD. I reached out to [name] by phone today in response to a referral sent by Mr. [employer]ear[employer]enton health plan.     Mr. Fogleman was given information about Chronic Care Management services today including:  1. CCM service includes personalized support from designated clinical staff supervised by his physician, including individualized plan of care and coordination with other care providers 2. 24/7 contact phone numbers for assistance for urgent and routine care needs. 3. Service will only be billed when office clinical staff spend 20 minutes or more in a month to coordinate care. 4. Only one practitioner may furnish and bill the service in a calendar month. 5. The patient may stop CCM services at any time (effective at the end of the month) by phone call to the office staff. 6. The patient will be responsible for cost sharing (co-pay) of up to 20% of the service fee (after annual deductible is met).  Patient agreed to services and verbal consent obtained.   Follow up plan: Telephone appointment with care management team member scheduled for: 04/02/2020.  [place], [place] [address] Direct Dial: [phone] [email or web address]

## 2020-04-02 ENCOUNTER — Ambulatory Visit: Payer: Medicare HMO | Admitting: Pharmacist

## 2020-04-02 ENCOUNTER — Other Ambulatory Visit: Payer: Self-pay

## 2020-04-02 DIAGNOSIS — E1129 Type 2 diabetes mellitus with other diabetic kidney complication: Secondary | ICD-10-CM

## 2020-04-02 DIAGNOSIS — I10 Essential (primary) hypertension: Secondary | ICD-10-CM

## 2020-04-02 NOTE — Patient Instructions (Addendum)
Visit Information  Goals Addressed            This Visit's Progress   . Diabetes - goal A1c < 7%       CARE PLAN ENTRY (see longitudinal plan of care for additional care plan information)  Current Barriers:  . Diabetes: type 2; complicated by chronic hypertension Lab Results  Component Value Date   HGBA1C 13.6 (H) 01/18/2020 .   Lab Results  Component Value Date   CREATININE 1.84 (H) 01/18/2020   CREATININE 1.33 (H) 10/24/2018   CREATININE 1.19 06/08/2018 .   Marland Kitchen No results found for: EGFR = 42 ml/min . Current antihyperglycemic regimen: glipizide, metformin . Denies hypoglycemic symptoms, including dizziness, lightheadedness, shaking, sweating . Reports hyperglycemic symptoms, including polyuria, polydipsia, polyphagia, nocturia, blurred vision, neuropathy . Current meal patterns: o Snacks:many at golf o Drinks: soda but most is diet . Current exercise: golf three times weekly . Current blood glucose readings: fasting 110 - 130 . Cardiovascular risk reduction: o Current hypertensive regimen: Zestoretic o Current hyperlipidemia regimen: Lipitor o Current antiplatelet regimen: aspirin  Pharmacist Clinical Goal(s):  Marland Kitchen Over the next 90 days, patient will work with PharmD and primary care provider to address very poor diet control  Interventions: . Comprehensive medication review performed, medication list updated in electronic medical record . Inter-disciplinary care team collaboration (see longitudinal plan of care) . Counseled extensively on healthy eating and timing with golf and snack temptations  Patient Self Care Activities:  . Patient will check blood glucose 2 hours after meals, document, and provide at future appointments . Patient will focus on adherence by eating before golf, preparing meals, portion control, avoid excessive snacks . Patient will take medications as prescribed . Patient will contact provider with any episodes of hyperglycemia . Patient will  report any questions or concerns to provider or clinical pharmacy  Initial goal documentation     . Hypertension - goal BP < 140/90       CARE PLAN ENTRY (see longitudinal plan of care for additional care plan information)  Current Barriers:  . Controlled hypertension, complicated by uncontrolled diabetes . Current antihypertensive regimen: Zestoretic . Previous antihypertensives tried: higher dose Zestoretic . Last practice recorded BP readings:  BP Readings from Last 3 Encounters:  03/13/20 (!) 143/78  01/30/20 128/72  01/18/20 118/74 .   Marland Kitchen Current home BP readings: doesn't measure . Most recent eGFR/CrCl: No results found for: EGFR=42  Pharmacist Clinical Goal(s):  Marland Kitchen Over the next 90 days, patient will work with PharmD and providers to continue optimized antihypertensive regimen  Interventions: . Inter-disciplinary care team collaboration (see longitudinal plan of care) . Comprehensive medication review performed; medication list updated in the electronic medical record.  . Counseling on goal (achieved)  Patient Self Care Activities:  . Patient to check BP weekly, document, and provide at future appointments . Patient will focus on medication adherence by focusing on diet adherence  Initial goal documentation        Austin Ortega was given information about Chronic Care Management services today including:  1. CCM service includes personalized support from designated clinical staff supervised by his physician, including individualized plan of care and coordination with other care providers 2. 24/7 contact phone numbers for assistance for urgent and routine care needs. 3. Standard insurance, coinsurance, copays and deductibles apply for chronic care management only during months in which we provide at least 20 minutes of these services. Most insurances cover these services at 100%, however patients may  be responsible for any copay, coinsurance and/or deductible if applicable.  This service may help you avoid the need for more expensive face-to-face services. 4. Only one practitioner may furnish and bill the service in a calendar month. 5. The patient may stop CCM services at any time (effective at the end of the month) by phone call to the office staff.  Patient agreed to services and verbal consent obtained.   Print copy of patient instructions provided.  Telephone follow up appointment with pharmacy team member scheduled for: 1 month  Milus Height, PharmD, [place], [facility] [phone]  Diabetic Nephropathy  Diabetic nephropathy is kidney disease that is caused by diabetes (diabetes mellitus). Kidneys are organs that filter and clean blood and get rid of body waste products and extra fluid. Diabetes can cause gradual kidney damage over many years. Diabetic nephropathy that continues to get worse can lead to kidney failure. What are the causes? This condition is caused by kidney damage from diabetes that is not well controlled with treatment. Having high blood sugar (glucose) for a long time because of diabetes can damage blood vessels in the kidneys and cause them to thicken and become scarred. Those changes prevent the kidneys from functioning normally. What increases the risk? This condition is more likely to develop in people with diabetes who:  Have had diabetes for many years.  Have high blood pressure.  Have high blood glucose levels over a long period of time.  Have a family history of kidney disease.  Have a history of tobacco use.  Have certain genes that are passed from parent to child (inherited). What are the signs or symptoms? This condition may not cause symptoms at first. If you do have symptoms, they may include:  Swelling of your hands, feet, or ankles.  Weakness.  Poor appetite.  Nausea.  Confusion.  Tiredness (fatigue).  Trouble sleeping.  Dry, itchy skin. If nephropathy leads  to kidney failure, symptoms may include:  Vomiting.  Shortness of breath.  Jerky movements that you cannot control (seizure).  Coma. How is this diagnosed? It is important to diagnose this condition before symptoms develop. You may be screened for diabetic nephropathy at a routine health care visit. Screening tests may include:  Urine tests. These may be done every year.  Urine collection over a 24-hour period to measure kidney function.  Blood tests to measure blood glucose levels and kidney function.  Regular blood pressure monitoring. If your health care provider suspects diabetic nephropathy, he or she may:  Review your medical history and symptoms.  Do a physical exam.  Do an ultrasound of your kidneys.  Perform a procedure to take a sample of kidney tissue for testing (biopsy). How is this treated? The goal of treatment is to prevent or slow down any damage to your kidneys by managing your diabetes. To do this, it is important to control:  Your blood pressure. ? Your target blood pressure may vary depending on your medical conditions, your age, and other factors. ? To help control blood pressure, you may be prescribed medicines to lower your blood pressure (ACE inhibitors) or to help your body get rid of excess fluid (diuretics).  Your A1c (hemoglobin A1c) level. Generally, the goal of treatment is to maintain an A1c level of less than 7%.  Your blood glucose level.  Your blood lipids. If you have high cholesterol, you may need to take lipid-lowering drugs, such as statins. Other treatments may include:  Medicines,  including insulin injections.  Lifestyle changes, such as losing weight, quitting smoking, or making changes to your diet. If your disease progresses to end-stage kidney failure, treatment may include:  Dialysis. This is a procedure to filter your blood with a machine.  Kidney transplant. Follow these instructions at home: Eating and drinking  Eat  healthy foods, and eat healthy snacks between meals. Follow instructions from your health care provider about eating and drinking restrictions.  Limit your sodium (salt), protein, or fluid intake as directed.  If you drink alcohol: ? Limit how much you use to:  0-1 drink a day for nonpregnant women.  0-2 drinks a day for men. ? Be aware of how much alcohol is in your drink. In the [place] one drink equals one 12 oz bottle of beer (355 mL), one 5 oz glass of wine (148 mL), or one 1 oz glass of hard liquor (44 mL). Lifestyle  Maintain a healthy weight. Work with your health care provider to lose weight, if needed.  Do not use any products that contain nicotine or tobacco, such as cigarettes, e-cigarettes, and chewing tobacco. If you need help quitting, ask your health care provider.  Be physically active every day. Ask your health care provider what type of exercise is best for you.  Work with your health care provider to manage your blood pressure. General instructions      Follow your diabetes management plan as directed. ? Check your blood glucose levels as directed by your health care provider. ? Keep your blood glucose in your target range as directed by your health care provider. ? Have your A1c level checked two or more times a year, or as often as told by your health care provider.  Measure your blood pressure regularly at home, as told by your health care provider.  Take over-the-counter and prescription medicines only as told by your health care provider. These include insulin and supplements.  Keep all follow-up visits and routine visits as told by your health care provider. This is important. Make sure you get screening tests as directed. Where to find more information American Diabetes Association: www.diabetes.org Contact a health care provider if:  You have trouble keeping your blood glucose in your goal range.  Your blood glucose level is higher than 240 mg/dL  (13.3 mmol/L) for 2 days in a row.  You have swelling in your hands, ankles, or feet.  You feel weak, tired, or dizzy.  You have sudden muscle tightening (spasms).  You have nausea or vomiting.  You feel tired all the time. Get help right away if:  You are very sleepy.  You faint.  You have: ? A seizure. ? Severe, painful muscle spasms. ? Shortness of breath. ? Chest pain. Summary  Diabetic nephropathy is kidney disease that is caused by diabetes (diabetes mellitus).  Keep your blood sugar (glucose) in your target range as directed by your health care provider.  Work with your health care provider to manage your blood pressure.  Keep all follow-up visits and routine visits as told by your health care provider. This is important. Make sure you get screening tests as directed. This information is not intended to replace advice given to you by your health care provider. Make sure you discuss any questions you have with your health care provider. Document Revised: 04/23/2019 Document Reviewed: 04/23/2019 Elsevier Patient Education  [place].

## 2020-04-02 NOTE — Chronic Care Management (AMB) (Signed)
Chronic Care Management Pharmacy  Name: Austin Ortega  MRN: [id number] DOB: 1951/02/02  Chief Complaint/ HPI  Austin Ortega,  69 y.o. , male presents for their Initial CCM visit with the clinical pharmacist via telephone due to COVID-19 Pandemic.  PCP : Steele Sizer, MD  Their chronic conditions include: HTN, DM, HLD  Office Visits: 3/10 DM, [place]les, BP 128/72 P 104 Wt 241 BMI 31.9, A1c 7.4 to 13.6! 2/26 HTN, Sowles, BP 118/74 P 82 Wt 248 BMI 32.7, proteinuria  Consult Visit: NA  Medications: Outpatient Encounter Medications as of 04/02/2020  Medication Sig Note  . amLODipine (NORVASC) 5 MG tablet Take 1 tablet (5 mg total) by mouth daily.   Marland Kitchen aspirin (ASPIRIN ADULT LOW DOSE) 81 MG EC tablet Take by mouth. 04/20/2016: Received from: [facility]  . atorvastatin (LIPITOR) 40 MG tablet Take 1 tablet (40 mg total) by mouth daily.   . Blood Glucose Monitoring Suppl (FREESTYLE FREEDOM LITE) w/Device KIT  04/20/2016: helenj (12/24/2008) - called to [facility] Received from: [facility]  . glipiZIDE (GLUCOTROL XL) 5 MG 24 hr tablet Daily   . lisinopril-hydrochlorothiazide (ZESTORETIC) 20-25 MG tablet Take 1 tablet by mouth daily.   . metFORMIN (GLUCOPHAGE-XR) 750 MG 24 hr tablet Take 2 tablets (1,500 mg total) by mouth daily with breakfast.    No facility-administered encounter medications on file as of 04/02/2020.      Social Connections: Not Isolated  . Frequency of Communication with Friends and Family: More than three times a week  . Frequency of Social Gatherings with Friends and Family: More than three times a week  . Attends Religious Services: More than 4 times per year  . Active Member of Clubs or Organizations: Yes  . Attends Archivist Meetings: More than 4 times per year  . Marital Status: Married   Current Diagnosis/Assessment:  Goals Addressed            This Visit's Progress   . Diabetes - goal A1c < 7%       CARE  PLAN ENTRY (see longitudinal plan of care for additional care plan information)  Current Barriers:  . Diabetes: type 2; complicated by chronic hypertension Lab Results  Component Value Date   HGBA1C 13.6 (H) 01/18/2020 .   Lab Results  Component Value Date   CREATININE 1.84 (H) 01/18/2020   CREATININE 1.33 (H) 10/24/2018   CREATININE 1.19 06/08/2018 .   Marland Kitchen No results found for: EGFR = 42 ml/min . Current antihyperglycemic regimen: glipizide, metformin . Denies hypoglycemic symptoms, including dizziness, lightheadedness, shaking, sweating . Reports hyperglycemic symptoms, including polyuria, polydipsia, polyphagia, nocturia, blurred vision, neuropathy . Current meal patterns: o Snacks:many at golf o Drinks: soda but most is diet . Current exercise: golf three times weekly . Current blood glucose readings: fasting 110 - 130 . Cardiovascular risk reduction: o Current hypertensive regimen: Zestoretic o Current hyperlipidemia regimen: Lipitor o Current antiplatelet regimen: aspirin  Pharmacist Clinical Goal(s):  Marland Kitchen Over the next 90 days, patient will work with PharmD and primary care provider to address very poor diet control  Interventions: . Comprehensive medication review performed, medication list updated in electronic medical record . Inter-disciplinary care team collaboration (see longitudinal plan of care) . Counseled extensively on healthy eating and timing with golf and snack temptations  Patient Self Care Activities:  . Patient will check blood glucose 2 hours after meals, document, and provide at future appointments . Patient will focus on adherence by eating before golf,  preparing meals, portion control, avoid excessive snacks . Patient will take medications as prescribed . Patient will contact provider with any episodes of hyperglycemia . Patient will report any questions or concerns to provider or clinical pharmacy  Initial goal documentation     . Hypertension -  goal BP < 140/90       CARE PLAN ENTRY (see longitudinal plan of care for additional care plan information)  Current Barriers:  . Controlled hypertension, complicated by uncontrolled diabetes . Current antihypertensive regimen: Zestoretic . Previous antihypertensives tried: higher dose Zestoretic . Last practice recorded BP readings:  BP Readings from Last 3 Encounters:  03/13/20 (!) 143/78  01/30/20 128/72  01/18/20 118/74 .   Marland Kitchen Current home BP readings: doesn't measure . Most recent eGFR/CrCl: No results found for: EGFR=42  Pharmacist Clinical Goal(s):  Marland Kitchen Over the next 90 days, patient will work with PharmD and providers to continue optimized antihypertensive regimen  Interventions: . Inter-disciplinary care team collaboration (see longitudinal plan of care) . Comprehensive medication review performed; medication list updated in the electronic medical record.  . Counseling on goal (achieved)  Patient Self Care Activities:  . Patient to check BP weekly, document, and provide at future appointments . Patient will focus on medication adherence by focusing on diet adherence  Initial goal documentation       Diabetes   Recent Relevant Labs: Lab Results  Component Value Date/Time   HGBA1C 13.6 (H) 01/18/2020 04:02 PM   HGBA1C 7.4 (A) 06/06/2019 09:30 AM   HGBA1C 6.8 (A) 10/24/2018 09:26 AM   HGBA1C 10.6 (A) 07/19/2018 11:43 AM   HGBA1C >14.0 06/08/2018 09:37 AM   HGBA1C 14.5 (H) 04/20/2016 03:08 PM   HGBA1C 7.2 04/10/2013 12:00 AM   MICROALBUR 4.7 01/18/2020 04:02 PM   MICROALBUR 0.9 10/24/2018 10:13 AM   MICROALBUR 100 04/20/2016 12:55 PM     Checking BG: Daily  Recent FBG Readings: Recent pre-meal BG readings: 110 - 130 Patient is currently controlled on the following medications: metformin, glipizide  Last diabetic Foot exam: No results found for: HMDIABEYEEXA  Last diabetic Eye exam: No results found for: HMDIABFOOTEX   We discussed: Diet and exercise Plays  golf tiwk, mostly cart Eating crazy, drinking sodas (usually diet) Chips, cakes, cookies while golfing Likes fries Salad, grilled chicken, ribs, beef, fish (peanut oil) Uses air fryer  Plan  Eat a healthy meal before golfing and bring cooler with healthy snacks Continue current medications  Hypertension   Office blood pressures are  BP Readings from Last 3 Encounters:  03/13/20 (!) 143/78  01/30/20 128/72  01/18/20 118/74    Patient has failed these meds in the past: NA  Patient checks BP at home infrequently  Patient home BP readings are ranging: NA  We discussed  Counseled on goal BP and that he is currently at goal BP < 140/90  Plan  Continue current medications   Milus Height, PharmD, [place], [place] Pharmacist [facility] [phone]

## 2020-04-08 ENCOUNTER — Ambulatory Visit (INDEPENDENT_AMBULATORY_CARE_PROVIDER_SITE_OTHER): Payer: Medicare HMO

## 2020-04-08 DIAGNOSIS — E1129 Type 2 diabetes mellitus with other diabetic kidney complication: Secondary | ICD-10-CM | POA: Diagnosis not present

## 2020-04-08 DIAGNOSIS — Z0000 Encounter for general adult medical examination without abnormal findings: Secondary | ICD-10-CM | POA: Diagnosis not present

## 2020-04-08 DIAGNOSIS — Z0100 Encounter for examination of eyes and vision without abnormal findings: Secondary | ICD-10-CM

## 2020-04-08 DIAGNOSIS — R809 Proteinuria, unspecified: Secondary | ICD-10-CM | POA: Diagnosis not present

## 2020-04-08 NOTE — Progress Notes (Signed)
Subjective:   Austin Ortega is a 69 y.o. male who presents for Medicare Annual/Subsequent preventive examination.  Virtual Visit via Telephone Note  I connected with  [name] on 04/08/20 at  3:30 PM EDT by telephone and verified that I am speaking with the correct person using two identifiers.  Medicare Annual Wellness visit completed telephonically due to Covid-19 pandemic.   Location: Patient: home Provider: office   I discussed the limitations, risks, security and privacy concerns of performing an evaluation and management service by telephone and the availability of in person appointments. The patient expressed understanding and agreed to proceed.  Unable to perform video visit due to patient does not have video capability.   Some vital signs may be absent or patient reported.   Clemetine Marker, LPN    Review of Systems:   Cardiac Risk Factors include: advanced age (>109mn, >>41women);male gender;dyslipidemia;diabetes mellitus;hypertension;obesity (BMI >30kg/m2)     Objective:    Vitals: There were no vitals taken for this visit.  There is no height or weight on file to calculate BMI.  Advanced Directives 04/08/2020 03/13/2020 06/08/2018 01/18/2017 07/21/2016 04/20/2016 06/27/2015  Does Patient Have a Medical Advance Directive? No Yes No Yes No No No  Type of Advance Directive - Healthcare [place] Would patient like information on creating a medical advance directive? Yes (MAU/Ambulatory/Procedural Areas - Information given) - Yes (MAU/Ambulatory/Procedural Areas - Information given) - No - patient declined information No - patient declined information No - patient declined information    Tobacco Social History   Tobacco Use  Smoking Status Former Smoker  . Packs/day: 0.50  . Years: 3.00  . Pack years: 1.50  . Types: Cigarettes  . Start date: 04/20/1978  . Quit date: 04/20/1981  . Years since quitting: 38.9  Smokeless  Tobacco Never Used  Tobacco Comment   more than 40 years ago     Counseling given: Not Answered Comment: more than 40 years ago   Clinical Intake:  Pre-visit preparation completed: Yes  Pain : No/denies pain     Nutritional Risks: None Diabetes: Yes CBG done?: No Did pt. bring in CBG monitor from home?: No   Nutrition Risk Assessment:  Has the patient had any N/V/D within the last 2 months?  No  Does the patient have any non-healing wounds?  No  Has the patient had any unintentional weight loss or weight gain?  No   Diabetes:  Is the patient diabetic?  Yes  If diabetic, was a CBG obtained today?  No  Did the patient bring in their glucometer from home?  No  How often do you monitor your CBG's? Every other day.   Financial Strains and Diabetes Management:  Are you having any financial strains with the device, your supplies or your medication? No .  Does the patient want to be seen by Chronic Care Management for management of their diabetes?  Yes  - already enrolled Would the patient like to be referred to a Nutritionist or for Diabetic Management?  No   Diabetic Exams:  Diabetic Eye Exam: Overdue for diabetic eye exam. Pt has been advised about the importance in completing this exam. A referral has been placed today. Message sent to referral coordinator for scheduling purposes. Advised pt to expect a call from our office re: appt.  Diabetic Foot Exam: Completed 01/30/20.   How often do you need to have someone help you when you  read instructions, pamphlets, or other written materials from your doctor or pharmacy?: 1 - Never  Interpreter Needed?: No  Information entered by :: Clemetine Marker LPN  Past Medical History:  Diagnosis Date  . Decreased libido   . Diabetes mellitus without complication ([place])   . Hyperlipidemia   . Hypertension    Past Surgical History:  Procedure Laterality Date  . COLONOSCOPY WITH PROPOFOL N/A 03/13/2020   Procedure: COLONOSCOPY WITH  PROPOFOL;  Surgeon: Jonathon Bellows, MD;  Location[facility] ENDOSCOPY;  Service: Gastroenterology;  Laterality: N/A;  . NO PAST SURGERIES     Family History  Problem Relation Age of Onset  . Diabetes Mother   . Hypertension Mother   . Cancer Father        Lung  . Hypertension Sister   . Diabetes Sister   . Hypertension Brother   . Hypertension Brother   . Heart attack Brother    Social History   Socioeconomic History  . Marital status: Married    Spouse name: Vaughan Basta  . Number of children: 4  . Years of education: some college  . Highest education level: 12th grade  Occupational History  . Occupation: Retired    Fish farm manager: Garment/textile technologist  Tobacco Use  . Smoking status: Former Smoker    Packs/day: 0.50    Years: 3.00    Pack years: 1.50    Types: Cigarettes    Start date: 04/20/1978    Quit date: 04/20/1981    Years since quitting: 38.9  . Smokeless tobacco: Never Used  . Tobacco comment: more than 40 years ago  Substance and Sexual Activity  . Alcohol use: No    Alcohol/week: 0.0 standard drinks  . Drug use: No  . Sexual activity: Yes    Partners: Female  Other Topics Concern  . Not on file  Social History Narrative  . Not on file   Social Determinants of Health   Financial Resource Strain: Low Risk   . Difficulty of Paying Living Expenses: Not hard at all  Food Insecurity: No Food Insecurity  . Worried About Charity fundraiser in the Last Year: Never true  . Ran Out of Food in the Last Year: Never true  Transportation Needs: No Transportation Needs  . Lack of Transportation (Medical): No  . Lack of Transportation (Non-Medical): No  Physical Activity: Sufficiently Active  . Days of Exercise per Week: 7 days  . Minutes of Exercise per Session: 30 min  Stress: No Stress Concern Present  . Feeling of Stress : Not at all  Social Connections: Not Isolated  . Frequency of Communication with Friends and Family: More than three times a week  . Frequency of Social  Gatherings with Friends and Family: More than three times a week  . Attends Religious Services: More than 4 times per year  . Active Member of Clubs or Organizations: Yes  . Attends Archivist Meetings: More than 4 times per year  . Marital Status: Married    Outpatient Encounter Medications as of 04/08/2020  Medication Sig  . amLODipine (NORVASC) 5 MG tablet Take 1 tablet (5 mg total) by mouth daily.  Marland Kitchen aspirin (ASPIRIN ADULT LOW DOSE) 81 MG EC tablet Take by mouth.  Marland Kitchen atorvastatin (LIPITOR) 40 MG tablet Take 1 tablet (40 mg total) by mouth daily.  . Blood Glucose Monitoring Suppl (FREESTYLE FREEDOM LITE) w/Device KIT   . Cod Liver Oil 1000 MG CAPS Take by mouth.  Marland Kitchen glipiZIDE (  GLUCOTROL XL) 5 MG 24 hr tablet Daily  . lisinopril-hydrochlorothiazide (ZESTORETIC) 20-25 MG tablet Take 1 tablet by mouth daily.  . metFORMIN (GLUCOPHAGE-XR) 750 MG 24 hr tablet Take 2 tablets (1,500 mg total) by mouth daily with breakfast.   No facility-administered encounter medications on file as of 04/08/2020.    Activities of Daily Living In your present state of health, do you have any difficulty performing the following activities: 04/08/2020 01/30/2020  Hearing? N N  Comment declines hearing aids -  Vision? N N  Comment - -  Difficulty concentrating or making decisions? N N  Walking or climbing stairs? N N  Dressing or bathing? N N  Doing errands, shopping? N N  Preparing Food and eating ? N -  Using the Toilet? N -  In the past six months, have you accidently leaked urine? N -  Do you have problems with loss of bowel control? N -  Managing your Medications? N -  Managing your Finances? N -  Housekeeping or managing your Housekeeping? N -  Some recent data might be hidden    Patient Care Team: Steele Sizer, MD as PCP - General (Family Medicine) Verdia Kuba, [facility] (Pharmacist)   Assessment:   This is a routine wellness examination for [employer].  Exercise Activities and Dietary  recommendations Current Exercise Habits: Home exercise routine, Time (Minutes): 30, Frequency (Times/Week): 7, Weekly Exercise (Minutes/Week): 210, Intensity: Mild, Exercise limited by: None identified  Goals    . Diabetes - goal A1c < 7%     CARE PLAN ENTRY (see longitudinal plan of care for additional care plan information)  Current Barriers:  . Diabetes: type 2; complicated by chronic hypertension Lab Results  Component Value Date   HGBA1C 13.6 (H) 01/18/2020 .   Lab Results  Component Value Date   CREATININE 1.84 (H) 01/18/2020   CREATININE 1.33 (H) 10/24/2018   CREATININE 1.19 06/08/2018 .   Marland Kitchen No results found for: EGFR = 42 ml/min . Current antihyperglycemic regimen: glipizide, metformin . Denies hypoglycemic symptoms, including dizziness, lightheadedness, shaking, sweating . Reports hyperglycemic symptoms, including polyuria, polydipsia, polyphagia, nocturia, blurred vision, neuropathy . Current meal patterns: o Snacks:many at golf o Drinks: soda but most is diet . Current exercise: golf three times weekly . Current blood glucose readings: fasting 110 - 130 . Cardiovascular risk reduction: o Current hypertensive regimen: Zestoretic o Current hyperlipidemia regimen: Lipitor o Current antiplatelet regimen: aspirin  Pharmacist Clinical Goal(s):  Marland Kitchen Over the next 90 days, patient will work with PharmD and primary care provider to address very poor diet control  Interventions: . Comprehensive medication review performed, medication list updated in electronic medical record . Inter-disciplinary care team collaboration (see longitudinal plan of care) . Counseled extensively on healthy eating and timing with golf and snack temptations  Patient Self Care Activities:  . Patient will check blood glucose 2 hours after meals, document, and provide at future appointments . Patient will focus on adherence by eating before golf, preparing meals, portion control, avoid excessive  snacks . Patient will take medications as prescribed . Patient will contact provider with any episodes of hyperglycemia . Patient will report any questions or concerns to provider or clinical pharmacy  Initial goal documentation     . DIET - INCREASE WATER INTAKE     Recommend to drink at least 6-8 8oz glasses of water per day.    . Hypertension - goal BP < 140/90     CARE PLAN ENTRY (see longitudinal  plan of care for additional care plan information)  Current Barriers:  . Controlled hypertension, complicated by uncontrolled diabetes . Current antihypertensive regimen: Zestoretic . Previous antihypertensives tried: higher dose Zestoretic . Last practice recorded BP readings:  BP Readings from Last 3 Encounters:  03/13/20 (!) 143/78  01/30/20 128/72  01/18/20 118/74 .   Marland Kitchen Current home BP readings: doesn't measure . Most recent eGFR/CrCl: No results found for: EGFR=42  Pharmacist Clinical Goal(s):  Marland Kitchen Over the next 90 days, patient will work with PharmD and providers to continue optimized antihypertensive regimen  Interventions: . Inter-disciplinary care team collaboration (see longitudinal plan of care) . Comprehensive medication review performed; medication list updated in the electronic medical record.  . Counseling on goal (achieved)  Patient Self Care Activities:  . Patient to check BP weekly, document, and provide at future appointments . Patient will focus on medication adherence by focusing on diet adherence  Initial goal documentation        Fall Risk Fall Risk  04/08/2020 01/30/2020 01/18/2020 06/06/2019 10/24/2018  Falls in the past year? 0 0 0 0 0  Number falls in past yr: 0 0 0 0 0  Injury with Fall? 0 0 0 0 0  Risk for fall due to : No Fall Risks - - - -  Risk for fall due to: Comment - - - - -  Follow up Falls prevention discussed - - - -   FALL RISK PREVENTION PERTAINING TO THE HOME:  Any stairs in or around the home? Yes  If so, do they handrails? Yes     Home free of loose throw rugs in walkways, pet beds, electrical cords, etc? Yes  Adequate lighting in your home to reduce risk of falls? Yes   ASSISTIVE DEVICES UTILIZED TO PREVENT FALLS:  Life alert? No  Use of a cane, walker or w/c? No  Grab bars in the bathroom? No  Shower chair or bench in shower? No  Elevated toilet seat or a handicapped toilet? No   DME ORDERS:  DME order needed?  No   TIMED UP AND GO:  Was the test performed? No . Telephonic visit.   Education: Fall risk prevention has been discussed.  Intervention(s) required? No   Depression Screen PHQ 2/9 Scores 04/08/2020 01/30/2020 01/18/2020 06/06/2019  PHQ - 2 Score 0 0 0 0  PHQ- 9 Score - 0 0 3    Cognitive Function     6CIT Screen 04/08/2020 06/08/2018  What Year? 0 points 0 points  What month? 0 points 0 points  What time? 0 points 0 points  Count back from 20 0 points 0 points  Months in reverse 0 points 2 points  Repeat phrase 0 points 2 points  Total Score 0 4    Immunization History  Administered Date(s) Administered  . Influenza, High Dose Seasonal PF 07/21/2016, 07/19/2018  . Influenza,inj,Quad PF,6+ Mos 12/25/2013, 10/01/2014  . Influenza-Unspecified 10/01/2014  . Pneumococcal Conjugate-13 10/01/2014  . Pneumococcal Polysaccharide-23 06/23/2010, 07/21/2016  . Pneumococcal-Unspecified 06/23/2010  . Tdap 07/04/2013    Qualifies for Shingles Vaccine? Yes . Due for Shingrix. Education has been provided regarding the importance of this vaccine. Pt has been advised to call insurance company to determine out of pocket expense. Advised may also receive vaccine at local pharmacy or Health Dept. Verbalized acceptance and understanding.  Tdap: Up to date  Flu Vaccine: Due for Flu vaccine. Does the patient want to receive this vaccine today?  No . Education has been  provided regarding the importance of this vaccine but still declined. Advised may receive this vaccine at local pharmacy or Health Dept.  Aware to provide a copy of the vaccination record if obtained from local pharmacy or Health Dept. Verbalized acceptance and understanding.  Pneumococcal Vaccine: Up to date  Covid-19 Vaccine: Due for Covid-19 vaccine. Education has been provided regarding the importance of this vaccine. Advised may receive this vaccine at local pharmacy, health department or [facility] clinic. Aware to provide a copy of the vaccination record if obtained from local pharmacy or health department. Verbalized acceptance and understanding.    Screening Tests Health Maintenance  Topic Date Due  . OPHTHALMOLOGY EXAM  Never done  . COVID-19 Vaccine (1) Never done  . INFLUENZA VACCINE  06/22/2020  . HEMOGLOBIN A1C  07/17/2020  . FOOT EXAM  01/29/2021  . COLONOSCOPY  03/14/2023  . TETANUS/TDAP  07/05/2023  . Hepatitis C Screening  Completed  . PNA vac Low Risk Adult  Completed   Cancer Screenings:  Colorectal Screening: Completed 03/13/20. Repeat every 3 years;   Lung Cancer Screening: (Low Dose CT Chest recommended if Age 57-80 years, 30 pack-year currently smoking OR have quit w/in 15years.) does not qualify.    Additional Screening:  Hepatitis C Screening: does qualify; Completed 05/14/13  Vision Screening: Recommended annual ophthalmology exams for early detection of glaucoma and other disorders of the eye. Is the patient up to date with their annual eye exam?  No  Who is the provider or what is the name of the office in which the pt attends annual eye exams? Not established If pt is not established with a provider, would they like to be referred to a provider to establish care? Yes . Ophthalmology referral has been placed. Pt aware the office will call re: appt.  Dental Screening: Recommended annual dental exams for proper oral hygiene  Community Resource Referral:  CRR required this visit?  No       Plan:    I have personally reviewed and addressed the Medicare Annual Wellness  questionnaire and have noted the following in the patient's chart:  A. Medical and social history B. Use of alcohol, tobacco or illicit drugs  C. Current medications and supplements D. Functional ability and status E.  Nutritional status F.  Physical activity G. Advance directives H. List of other physicians I.  Hospitalizations, surgeries, and ER visits in previous 12 months J.  [place] such as hearing and vision if needed, cognitive and depression L. Referrals and appointments   In addition, I have reviewed and discussed with patient certain preventive protocols, quality metrics, and best practice recommendations. A written personalized care plan for preventive services as well as general preventive health recommendations were provided to patient.   Signed,  Clemetine Marker, LPN Nurse Health Advisor   Nurse Notes: none

## 2020-04-08 NOTE — Patient Instructions (Signed)
[name] , Thank you for taking time to come for your Medicare Wellness Visit. I appreciate your ongoing commitment to your health goals. Please review the following plan we discussed and let me know if I can assist you in the future.   Screening recommendations/referrals: Colonoscopy: done 03/13/20 Recommended yearly ophthalmology/optometry visit for glaucoma screening and checkup Recommended yearly dental visit for hygiene and checkup  Vaccinations: Influenza vaccine: postponed Pneumococcal vaccine: done 07/21/16 Tdap vaccine: done 07/04/13 Shingles vaccine: Shingrix discussed. Please contact your pharmacy for coverage information.  Covid-19: discussed  Advanced directives: Advance directive discussed with you today. I have provided a copy for you to complete at home and have notarized. Once this is complete please bring a copy in to our office so we can scan it into your chart.  Conditions/risks identified: Recommend healthy eating and physical activity to lower A1c  Next appointment: Please follow up in one year for your Medicare Annual Wellness visit.    Preventive Care 70 Years and Older, Male Preventive care refers to lifestyle choices and visits with your health care provider that can promote health and wellness. What does preventive care include?  A yearly physical exam. This is also called an annual well check.  Dental exams once or twice a year.  Routine eye exams. Ask your health care provider how often you should have your eyes checked.  Personal lifestyle choices, including:  Daily care of your teeth and gums.  Regular physical activity.  Eating a healthy diet.  Avoiding tobacco and drug use.  Limiting alcohol use.  Practicing safe sex.  Taking low doses of aspirin every day.  Taking vitamin and mineral supplements as recommended by your health care provider. What happens during an annual well check? The services and screenings done by your health care  provider during your annual well check will depend on your age, overall health, lifestyle risk factors, and family history of disease. Counseling  Your health care provider may ask you questions about your:  Alcohol use.  Tobacco use.  Drug use.  Emotional well-being.  Home and relationship well-being.  Sexual activity.  Eating habits.  History of falls.  Memory and ability to understand (cognition).  Work and work Statistician. Screening  You may have the following tests or measurements:  Height, weight, and BMI.  Blood pressure.  Lipid and cholesterol levels. These may be checked every 5 years, or more frequently if you are over 70 years old.  Skin check.  Lung cancer screening. You may have this screening every year starting at age 70 if you have a 30-pack-year history of smoking and currently smoke or have quit within the past 15 years.  Fecal occult blood test (FOBT) of the stool. You may have this test every year starting at age 70.  Flexible sigmoidoscopy or colonoscopy. You may have a sigmoidoscopy every 5 years or a colonoscopy every 10 years starting at age 70.  Prostate cancer screening. Recommendations will vary depending on your family history and other risks.  Hepatitis C blood test.  Hepatitis B blood test.  Sexually transmitted disease (STD) testing.  Diabetes screening. This is done by checking your blood sugar (glucose) after you have not eaten for a while (fasting). You may have this done every 1-3 years.  Abdominal aortic aneurysm (AAA) screening. You may need this if you are a current or former smoker.  Osteoporosis. You may be screened starting at age 70 if you are at high risk. Talk with your health  care provider about your test results, treatment options, and if necessary, the need for more tests. Vaccines  Your health care provider may recommend certain vaccines, such as:  Influenza vaccine. This is recommended every year.  Tetanus,  diphtheria, and acellular pertussis (Tdap, Td) vaccine. You may need a Td booster every 10 years.  Zoster vaccine. You may need this after age 70.  Pneumococcal 13-valent conjugate (PCV13) vaccine. One dose is recommended after age 70.  Pneumococcal polysaccharide (PPSV23) vaccine. One dose is recommended after age 70. Talk to your health care provider about which screenings and vaccines you need and how often you need them. This information is not intended to replace advice given to you by your health care provider. Make sure you discuss any questions you have with your health care provider. Document Released: 12/05/2015 Document Revised: 07/28/2016 Document Reviewed: 09/09/2015 Elsevier Interactive Patient Education  2017 [place] Prevention in the Home Falls can cause injuries. They can happen to people of all ages. There are many things you can do to make your home safe and to help prevent falls. What can I do on the outside of my home?  Regularly fix the edges of walkways and driveways and fix any cracks.  Remove anything that might make you trip as you walk through a door, such as a raised step or threshold.  Trim any bushes or trees on the path to your home.  Use bright outdoor lighting.  Clear any walking paths of anything that might make someone trip, such as rocks or tools.  Regularly check to see if handrails are loose or broken. Make sure that both sides of any steps have handrails.  Any raised decks and porches should have guardrails on the edges.  Have any leaves, snow, or ice cleared regularly.  Use sand or salt on walking paths during winter.  Clean up any spills in your garage right away. This includes oil or grease spills. What can I do in the bathroom?  Use night lights.  Install grab bars by the toilet and in the tub and shower. Do not use towel bars as grab bars.  Use non-skid mats or decals in the tub or shower.  If you need to sit down in  the shower, use a plastic, non-slip stool.  Keep the floor dry. Clean up any water that spills on the floor as soon as it happens.  Remove soap buildup in the tub or shower regularly.  Attach bath mats securely with double-sided non-slip rug tape.  Do not have throw rugs and other things on the floor that can make you trip. What can I do in the bedroom?  Use night lights.  Make sure that you have a light by your bed that is easy to reach.  Do not use any sheets or blankets that are too big for your bed. They should not hang down onto the floor.  Have a firm chair that has side arms. You can use this for support while you get dressed.  Do not have throw rugs and other things on the floor that can make you trip. What can I do in the kitchen?  Clean up any spills right away.  Avoid walking on wet floors.  Keep items that you use a lot in easy-to-reach places.  If you need to reach something above you, use a strong step stool that has a grab bar.  Keep electrical cords out of the way.  Do not use floor  polish or wax that makes floors slippery. If you must use wax, use non-skid floor wax.  Do not have throw rugs and other things on the floor that can make you trip. What can I do with my stairs?  Do not leave any items on the stairs.  Make sure that there are handrails on both sides of the stairs and use them. Fix handrails that are broken or loose. Make sure that handrails are as long as the stairways.  Check any carpeting to make sure that it is firmly attached to the stairs. Fix any carpet that is loose or worn.  Avoid having throw rugs at the top or bottom of the stairs. If you do have throw rugs, attach them to the floor with carpet tape.  Make sure that you have a light switch at the top of the stairs and the bottom of the stairs. If you do not have them, ask someone to add them for you. What else can I do to help prevent falls?  Wear shoes that:  Do not have high  heels.  Have rubber bottoms.  Are comfortable and fit you well.  Are closed at the toe. Do not wear sandals.  If you use a stepladder:  Make sure that it is fully opened. Do not climb a closed stepladder.  Make sure that both sides of the stepladder are locked into place.  Ask someone to hold it for you, if possible.  Clearly mark and make sure that you can see:  Any grab bars or handrails.  First and last steps.  Where the edge of each step is.  Use tools that help you move around (mobility aids) if they are needed. These include:  Canes.  Walkers.  Scooters.  Crutches.  Turn on the lights when you go into a dark area. Replace any light bulbs as soon as they burn out.  Set up your furniture so you have a clear path. Avoid moving your furniture around.  If any of your floors are uneven, fix them.  If there are any pets around you, be aware of where they are.  Review your medicines with your doctor. Some medicines can make you feel dizzy. This can increase your chance of falling. Ask your doctor what other things that you can do to help prevent falls. This information is not intended to replace advice given to you by your health care provider. Make sure you discuss any questions you have with your health care provider. Document Released: 09/04/2009 Document Revised: 04/15/2016 Document Reviewed: 12/13/2014 Elsevier Interactive Patient Education  2017 [employer].

## 2020-04-18 ENCOUNTER — Other Ambulatory Visit: Payer: Self-pay | Admitting: Family Medicine

## 2020-04-18 DIAGNOSIS — E785 Hyperlipidemia, unspecified: Secondary | ICD-10-CM

## 2020-04-18 DIAGNOSIS — I10 Essential (primary) hypertension: Secondary | ICD-10-CM

## 2020-04-18 DIAGNOSIS — E1129 Type 2 diabetes mellitus with other diabetic kidney complication: Secondary | ICD-10-CM

## 2020-04-18 NOTE — Telephone Encounter (Signed)
Requested Prescriptions  Pending Prescriptions Disp Refills  . metFORMIN (GLUCOPHAGE-XR) 750 MG 24 hr tablet [Pharmacy Med Name: metFORMIN HCl ER 750 MG Oral Tablet Extended Release 24 Hour] 180 tablet 0    Sig: TAKE 2 TABLETS BY MOUTH ONCE DAILY WITH BREAKFAST     Endocrinology:  Diabetes - Biguanides Failed - 04/18/2020  2:57 PM      Failed - Cr in normal range and within 360 days    Creat  Date Value Ref Range Status  01/18/2020 1.84 (H) 0.70 - 1.25 mg/dL Final    Comment:    For patients >38 years of age, the reference limit for Creatinine is approximately 13% higher for people identified as African-American. .    Creatinine, Urine  Date Value Ref Range Status  01/18/2020 538 (H) 20 - 320 mg/dL Final    Comment:    Verified by repeat analysis. .          Failed - HBA1C is between 0 and 7.9 and within 180 days    Hemoglobin A1C  Date Value Ref Range Status  04/10/2013 7.2  Final   HbA1c, POC (controlled diabetic range)  Date Value Ref Range Status  06/06/2019 7.4 (A) 0.0 - 7.0 % Final   Hgb A1c MFr Bld  Date Value Ref Range Status  01/18/2020 13.6 (H) <5.7 % of total Hgb Final    Comment:    For someone without known diabetes, a hemoglobin A1c value of 6.5% or greater indicates that they may have  diabetes and this should be confirmed with a follow-up  test. . For someone with known diabetes, a value <7% indicates  that their diabetes is well controlled and a value  greater than or equal to 7% indicates suboptimal  control. A1c targets should be individualized based on  duration of diabetes, age, comorbid conditions, and  other considerations. . Currently, no consensus exists regarding use of hemoglobin A1c for diagnosis of diabetes for children. .          Failed - eGFR in normal range and within 360 days    GFR, Est African American  Date Value Ref Range Status  01/18/2020 42 (L) > OR = 60 mL/min/1.28m Final   GFR, Est Non African American  Date  Value Ref Range Status  01/18/2020 37 (L) > OR = 60 mL/min/1.731mFinal         Passed - Valid encounter within last 6 months    Recent Outpatient Visits          2 months ago [facility]controlled type 2 [facility] ([facility]   3 months ago Benign essential HTN   [facility]   10 months ago Type 2 diabetes mellitus with microalbuminuria, without long-term current use of insulin ([facility]  [facility]   1 year ago Type 2 diabetes mellitus with microalbuminuria, without long-term current use of insulin ([facility]  [facility]   1 year ago Type 2 diabetes mellitus with microalbuminuria, without long-term current use of insulin ([facility]  [facility]izer[facility]      Future Appointments            In 2 weeks SoAncil BoozerKrDrue StagerMD [facility] In 11 months  [facility]         .  amLODipine (NORVASC) 5 MG tablet [Pharmacy Med Name: amLODIPine Besylate 5 MG Oral Tablet] 90 tablet 0    Sig: Take 1 tablet by mouth once daily     Cardiovascular:  Calcium Channel Blockers Failed - 04/18/2020  2:57 PM      Failed - Last BP in normal range    BP Readings from Last 1 Encounters:  03/13/20 (!) 143/78         Passed - Valid encounter within last 6 months    Recent Outpatient Visits          2 months ago Uncontrolled type 2 diabetes mellitus with hyperglycemia [facility])   [facility] [place], Drue Stager, MD   3 months ago Benign essential HTN   [facility] [place], Drue Stager, MD   10 months ago Type 2 diabetes mellitus with microalbuminuria, without long-term current use of insulin [facility])   [facility] [place], Drue Stager, MD   1 year ago Type 2 diabetes mellitus with microalbuminuria, without  long-term current use of insulin [facility])   [facility] [place], Drue Stager, MD   1 year ago Type 2 diabetes mellitus with microalbuminuria, without long-term current use of insulin [facility])   [facility] Steele Sizer, MD      Future Appointments            In 2 weeks Ancil Boozer, Drue Stager, MD [facility], [place]   In 11 months  [facility], [place]           . atorvastatin (LIPITOR) 40 MG tablet [Pharmacy Med Name: Atorvastatin Calcium 40 MG Oral Tablet] 90 tablet 0    Sig: Take 1 tablet by mouth once daily     Cardiovascular:  Antilipid - Statins Passed - 04/18/2020  2:57 PM      Passed - Total Cholesterol in normal range and within 360 days    Cholesterol, Total  Date Value Ref Range Status  04/20/2016 230 (H) 100 - 199 mg/dL Final   Cholesterol  Date Value Ref Range Status  01/18/2020 160 <200 mg/dL Final         Passed - LDL in normal range and within 360 days    LDL Cholesterol (Calc)  Date Value Ref Range Status  01/18/2020 88 mg/dL (calc) Final    Comment:    Reference range: <100 . Desirable range <100 mg/dL for primary prevention;   <70 mg/dL for patients with CHD or diabetic patients  with > or = 2 CHD risk factors. Marland Kitchen LDL-C is now calculated using the Martin-Hopkins  calculation, which is a validated novel method providing  better accuracy than the Friedewald equation in the  estimation of LDL-C.  Cresenciano Genre et al. Annamaria Helling. 8185;631(49): 2061-2068  (http://education.[employer].com/faq/FAQ164)          Passed - HDL in normal range and within 360 days    HDL  Date Value Ref Range Status  01/18/2020 47 > OR = 40 mg/dL Final  04/20/2016 54 >39 mg/dL Final         Passed - Triglycerides in normal range and within 360 days    Triglycerides  Date Value Ref Range Status  01/18/2020 146 <150 mg/dL Final         Passed - Patient is not pregnant      Passed - Valid encounter within last 12  months    Recent Outpatient Visits          2 months  ago Uncontrolled type 2 diabetes mellitus with hyperglycemia [facility])   [facility] [place], Drue Stager, MD   3 months ago Benign essential HTN   [facility] [place], Drue Stager, MD   10 months ago Type 2 diabetes mellitus with microalbuminuria, without long-term current use of insulin [facility])   [facility] [place], Drue Stager, MD   1 year ago Type 2 diabetes mellitus with microalbuminuria, without long-term current use of insulin [facility])   [facility] [place], Drue Stager, MD   1 year ago Type 2 diabetes mellitus with microalbuminuria, without long-term current use of insulin [facility])   [facility] Steele Sizer, MD      Future Appointments            In 2 weeks Ancil Boozer, Drue Stager, MD [facility], [place]   In 11 months  [facility], [place]           . glipiZIDE (GLUCOTROL XL) 5 MG 24 hr tablet [Pharmacy Med Name: glipiZIDE ER 5 MG Oral Tablet Extended Release 24 Hour] 90 tablet 0    Sig: Take 1 tablet by mouth once daily     Endocrinology:  Diabetes - Sulfonylureas Failed - 04/18/2020  2:57 PM      Failed - HBA1C is between 0 and 7.9 and within 180 days    Hemoglobin A1C  Date Value Ref Range Status  04/10/2013 7.2  Final   HbA1c, POC (controlled diabetic range)  Date Value Ref Range Status  06/06/2019 7.4 (A) 0.0 - 7.0 % Final   Hgb A1c MFr Bld  Date Value Ref Range Status  01/18/2020 13.6 (H) <5.7 % of total Hgb Final    Comment:    For someone without known diabetes, a hemoglobin A1c value of 6.5% or greater indicates that they may have  diabetes and this should be confirmed with a follow-up  test. . For someone with known diabetes, a value <7% indicates  that their diabetes is well controlled and a value  greater than or equal to 7% indicates suboptimal  control. A1c targets should be individualized based  on  duration of diabetes, age, comorbid conditions, and  other considerations. . Currently, no consensus exists regarding use of hemoglobin A1c for diagnosis of diabetes for children. Renella Cunas - Valid encounter within last 6 months    Recent Outpatient Visits          2 months ago Uncontrolled type 2 diabetes mellitus with hyperglycemia [facility])   [facility] [place], Drue Stager, MD   3 months ago Benign essential HTN   [facility] [place], Drue Stager, MD   10 months ago Type 2 diabetes mellitus with microalbuminuria, without long-term current use of insulin [facility])   [facility], Drue Stager, MD   1 year ago Type 2 diabetes mellitus with microalbuminuria, without long-term current use of insulin [facility])   [facility] [place], Drue Stager, MD   1 year ago Type 2 diabetes mellitus with microalbuminuria, without long-term current use of insulin [facility])   [facility] Steele Sizer, MD      Future Appointments            In 2 weeks Ancil Boozer, Drue Stager, MD [facility], [place]   In 9 months  [facility], [place]           . lisinopril-hydrochlorothiazide (  ZESTORETIC) 20-25 MG tablet [Pharmacy Med Name: Lisinopril-hydroCHLOROthiazide 20-25 MG Oral Tablet] 90 tablet 0    Sig: Take 1 tablet by mouth once daily     Cardiovascular:  ACEI + Diuretic Combos Failed - 04/18/2020  2:57 PM      Failed - Cr in normal range and within 180 days    Creat  Date Value Ref Range Status  01/18/2020 1.84 (H) 0.70 - 1.25 mg/dL Final    Comment:    For patients >1 years of age, the reference limit for Creatinine is approximately 13% higher for people identified as African-American. .    Creatinine, Urine  Date Value Ref Range Status  01/18/2020 538 (H) 20 - 320 mg/dL Final    Comment:    Verified by repeat analysis. .          Failed - Last BP in normal range    BP  Readings from Last 1 Encounters:  03/13/20 (!) 143/78         Passed - Na in normal range and within 180 days    Sodium  Date Value Ref Range Status  01/18/2020 137 135 - 146 mmol/L Final  04/20/2016 138 134 - 144 mmol/L Final         Passed - K in normal range and within 180 days    Potassium  Date Value Ref Range Status  01/18/2020 4.5 3.5 - 5.3 mmol/L Final         Passed - Ca in normal range and within 180 days    Calcium  Date Value Ref Range Status  01/18/2020 9.5 8.6 - 10.3 mg/dL Final         Passed - Patient is not pregnant      Passed - Valid encounter within last 6 months    Recent Outpatient Visits          2 months ago Uncontrolled type 2 diabetes mellitus with hyperglycemia [facility])   [facility] [place], Drue Stager, MD   3 months ago Benign essential HTN   [facility] [place], Drue Stager, MD   10 months ago Type 2 diabetes mellitus with microalbuminuria, without long-term current use of insulin [facility])   [facility] [place], Drue Stager, MD   1 year ago Type 2 diabetes mellitus with microalbuminuria, without long-term current use of insulin [facility])   [facility] [place], Drue Stager, MD   1 year ago Type 2 diabetes mellitus with microalbuminuria, without long-term current use of insulin [facility])   [facility] Steele Sizer, MD      Future Appointments            In 2 weeks Ancil Boozer, Drue Stager, MD [facility], [place]   In 11 months  [facility], [facility]

## 2020-04-30 ENCOUNTER — Ambulatory Visit: Payer: Self-pay | Admitting: Pharmacist

## 2020-04-30 ENCOUNTER — Other Ambulatory Visit: Payer: Self-pay

## 2020-04-30 DIAGNOSIS — E1129 Type 2 diabetes mellitus with other diabetic kidney complication: Secondary | ICD-10-CM

## 2020-04-30 DIAGNOSIS — I10 Essential (primary) hypertension: Secondary | ICD-10-CM

## 2020-04-30 NOTE — Patient Instructions (Signed)
Visit Information  Goals Addressed            This Visit's Progress   . Diabetes - goal A1c < 7%       CARE PLAN ENTRY (see longitudinal plan of care for additional care plan information)  Current Barriers:  . Diabetes: type 2; complicated by chronic hypertension Lab Results  Component Value Date   HGBA1C 13.6 (H) 01/18/2020 .   Lab Results  Component Value Date   CREATININE 1.84 (H) 01/18/2020   CREATININE 1.33 (H) 10/24/2018   CREATININE 1.19 06/08/2018 .   . No results found for: EGFR = 42 ml/min . Current antihyperglycemic regimen: glipizide, metformin . Denies hypoglycemic symptoms, including dizziness, lightheadedness, shaking, sweating . Reports hyperglycemic symptoms, including polyuria, polydipsia, polyphagia, nocturia, blurred vision, neuropathy . Current meal patterns: o Snacks:many at golf, improved o Drinks: soda but most is diet . Current exercise: golf three times weekly . Current blood glucose readings: fasting 110 - 130 . Cardiovascular risk reduction: o Current hypertensive regimen: Zestoretic o Current hyperlipidemia regimen: Lipitor o Current antiplatelet regimen: aspirin  Pharmacist Clinical Goal(s):  . Over the next 90 days, patient will work with PharmD and primary care provider to address very poor diet control  Interventions: . Comprehensive medication review performed, medication list updated in electronic medical record . Inter-disciplinary care team collaboration (see longitudinal plan of care) . Counseled extensively on healthy eating and timing with golf and snack temptations  Patient Self Care Activities:  . Patient will check blood glucose 2 hours after meals, document, and provide at future appointments . Patient will focus on adherence by eating before golf, preparing meals, portion control, avoid excessive snacks . Patient will take medications as prescribed . Patient will contact provider with any episodes of  hyperglycemia . Patient will report any questions or concerns to provider or clinical pharmacy  Initial goal documentation        Print copy of patient instructions provided.   Telephone follow up appointment with pharmacy team member scheduled for: 6 month  Ted Hancock, PharmD, BCGP, CTTS Clinical Pharmacist [facility] [phone]  

## 2020-04-30 NOTE — Chronic Care Management (AMB) (Signed)
Chronic Care Management Pharmacy  Name: Austin Ortega  MRN: [id number] DOB: 07/24/1951  Chief Complaint/ HPI  [name],  69 y.o. , male presents for their Follow-Up CCM visit with the clinical pharmacist via telephone due to COVID-19 Pandemic.  PCP : Steele Sizer, MD  Their chronic conditions include: DM, HTN  Office Visits: NA  Consult Visit: NA  Medications: Outpatient Encounter Medications as of 04/30/2020  Medication Sig Note  . amLODipine (NORVASC) 5 MG tablet Take 1 tablet by mouth once daily   . aspirin (ASPIRIN ADULT LOW DOSE) 81 MG EC tablet Take by mouth. 04/20/2016: Received from: [facility]  . atorvastatin (LIPITOR) 40 MG tablet Take 1 tablet by mouth once daily   . Blood Glucose Monitoring Suppl (FREESTYLE FREEDOM LITE) w/Device KIT    . Cod Liver Oil 1000 MG CAPS Take by mouth.   Marland Kitchen glipiZIDE (GLUCOTROL XL) 5 MG 24 hr tablet Take 1 tablet by mouth once daily   . lisinopril-hydrochlorothiazide (ZESTORETIC) 20-25 MG tablet Take 1 tablet by mouth once daily   . metFORMIN (GLUCOPHAGE-XR) 750 MG 24 hr tablet TAKE 2 TABLETS BY MOUTH ONCE DAILY WITH BREAKFAST    No facility-administered encounter medications on file as of 04/30/2020.     Financial Resource Strain: Low Risk   . Difficulty of Paying Living Expenses: Not hard at all    Current Diagnosis/Assessment:  Goals Addressed            This Visit's Progress   . Diabetes - goal A1c < 7%       CARE PLAN ENTRY (see longitudinal plan of care for additional care plan information)  Current Barriers:  . Diabetes: type 2; complicated by chronic hypertension Lab Results  Component Value Date   HGBA1C 13.6 (H) 01/18/2020 .   Lab Results  Component Value Date   CREATININE 1.84 (H) 01/18/2020   CREATININE 1.33 (H) 10/24/2018   CREATININE 1.19 06/08/2018 .   Marland Kitchen No results found for: EGFR = 42 ml/min . Current antihyperglycemic regimen: glipizide, metformin . Denies hypoglycemic  symptoms, including dizziness, lightheadedness, shaking, sweating . Reports hyperglycemic symptoms, including polyuria, polydipsia, polyphagia, nocturia, blurred vision, neuropathy . Current meal patterns: o Snacks:many at golf, improved o Drinks: soda but most is diet . Current exercise: golf three times weekly . Current blood glucose readings: fasting 110 - 130 . Cardiovascular risk reduction: o Current hypertensive regimen: Zestoretic o Current hyperlipidemia regimen: Lipitor o Current antiplatelet regimen: aspirin  Pharmacist Clinical Goal(s):  Marland Kitchen Over the next 90 days, patient will work with PharmD and primary care provider to address very poor diet control  Interventions: . Comprehensive medication review performed, medication list updated in electronic medical record . Inter-disciplinary care team collaboration (see longitudinal plan of care) . Counseled extensively on healthy eating and timing with golf and snack temptations  Patient Self Care Activities:  . Patient will check blood glucose 2 hours after meals, document, and provide at future appointments . Patient will focus on adherence by eating before golf, preparing meals, portion control, avoid excessive snacks . Patient will take medications as prescribed . Patient will contact provider with any episodes of hyperglycemia . Patient will report any questions or concerns to provider or clinical pharmacy  Initial goal documentation       Diabetes   Recent Relevant Labs: Lab Results  Component Value Date/Time   HGBA1C 13.6 (H) 01/18/2020 04:02 PM   HGBA1C 7.4 (A) 06/06/2019 09:30 AM   HGBA1C 6.8 (A)  10/24/2018 09:26 AM   HGBA1C 10.6 (A) 07/19/2018 11:43 AM   HGBA1C >14.0 06/08/2018 09:37 AM   HGBA1C 14.5 (H) 04/20/2016 03:08 PM   HGBA1C 7.2 04/10/2013 12:00 AM   MICROALBUR 4.7 01/18/2020 04:02 PM   MICROALBUR 0.9 10/24/2018 10:13 AM   MICROALBUR 100 04/20/2016 12:55 PM     Checking BG: Weekly  Recent FBG  Readings: 119 - 120 Patient is currently uncontrolled on the following medications: metformin, glipizide  We discussed:  Brings food to golf course now Significant dietary improvements  Plan  Continue current medications   Medication Management   Pt uses [facility] for all medications Uses pill box? Yes Pt endorses 100% compliance  We discussed:   Plan  Continue current medication management strategy  Follow up: 6 month phone visit  Milus Height, PharmD, [place], [facility] [phone]

## 2020-05-02 ENCOUNTER — Ambulatory Visit: Payer: Medicare HMO | Admitting: Family Medicine

## 2020-05-20 ENCOUNTER — Ambulatory Visit: Payer: Medicare HMO | Admitting: Family Medicine

## 2020-08-06 ENCOUNTER — Telehealth: Payer: Self-pay

## 2020-08-06 NOTE — Progress Notes (Signed)
..   What problems have you recently had with your health? None What recent interventions/DTPs have been made by any provider since last CPP Visit?None Any recent hospitalizations or ED visits since last visit with CPP?None What problems have you had recently with your health? Patient's brother recently diagnosed with Covid 74. Patient tested negative but had severe headache and body fatigue. Patient is feeling better now.  What problems have you had recently with your pharmacy?None. Patient enjoys interacting with people when he goes to the pharmacy.  Are you having to go without any meds due to price? No What issues are side effects are you having with your medications? Not that he is aware of What would you like me to pass along to [facility] for them to help you with? No  Patients blood glucose have been between 110-130. He has not been playing golf as much lately due to the heat and his golf buddies wanting to play 24-36 holes at a time so his diet has improved. Patient does have a big golf trip planned for 9/23-9/25 to [facility]. Per Ted's notes we discussed how he planned to adhere to diet while on trip. Patient is planning on bringing his own healthier snacks to avoid the chips and sweets while on the golf course.    [place], [place]

## 2020-09-08 ENCOUNTER — Other Ambulatory Visit: Payer: Self-pay | Admitting: Family Medicine

## 2020-09-08 DIAGNOSIS — E1169 Type 2 diabetes mellitus with other specified complication: Secondary | ICD-10-CM

## 2020-09-08 DIAGNOSIS — I10 Essential (primary) hypertension: Secondary | ICD-10-CM

## 2020-09-08 DIAGNOSIS — E1129 Type 2 diabetes mellitus with other diabetic kidney complication: Secondary | ICD-10-CM

## 2020-09-08 DIAGNOSIS — E785 Hyperlipidemia, unspecified: Secondary | ICD-10-CM

## 2020-09-12 ENCOUNTER — Telehealth: Payer: Self-pay | Admitting: Family Medicine

## 2020-09-12 DIAGNOSIS — R809 Proteinuria, unspecified: Secondary | ICD-10-CM

## 2020-09-12 DIAGNOSIS — E1129 Type 2 diabetes mellitus with other diabetic kidney complication: Secondary | ICD-10-CM

## 2020-09-12 NOTE — Telephone Encounter (Signed)
Requested medications are due for refill today yes  Requested medications are on the active medication list yes  Last refill 6/10  Last visit 01/2020  Future visit scheduled 10/2020  Notes to clinic Visit 7 months ago, has scheduled visit that is not until 10/2020, please assess.

## 2020-09-16 ENCOUNTER — Other Ambulatory Visit: Payer: Self-pay

## 2020-09-16 DIAGNOSIS — R809 Proteinuria, unspecified: Secondary | ICD-10-CM

## 2020-09-16 MED ORDER — METFORMIN HCL ER 750 MG PO TB24: ORAL_TABLET | ORAL | 0 refills | Status: DC

## 2020-10-06 ENCOUNTER — Telehealth: Payer: Self-pay

## 2020-10-06 NOTE — Progress Notes (Signed)
  Three calls placed to pt for disease state call regarding diabetes. Pt has not returned any of the calls. Call deferred for one month. Riki Rusk Pharm D aware.

## 2020-10-30 ENCOUNTER — Telehealth: Payer: Self-pay

## 2020-10-30 NOTE — Chronic Care Management (AMB) (Deleted)
   Chronic Care Management Pharmacy  Name: Eliyas Suddreth  MRN: [id number] DOB: April 22, 1951  Chief Complaint/ HPI  [name],  69 y.o. , male presents for their Follow-Up CCM visit with the clinical pharmacist via telephone due to COVID-19 Pandemic.  PCP : Steele Sizer, MD  Their chronic conditions include: DM, HTN  Office Visits: NA  Consult Visit: NA  Medications: Outpatient Encounter Medications as of 10/30/2020  Medication Sig Note  . amLODipine (NORVASC) 5 MG tablet Take 1 tablet by mouth once daily   . aspirin (ASPIRIN ADULT LOW DOSE) 81 MG EC tablet Take by mouth. 04/20/2016: Received from: [facility]  . atorvastatin (LIPITOR) 40 MG tablet Take 1 tablet by mouth once daily   . Blood Glucose Monitoring Suppl (FREESTYLE FREEDOM LITE) w/Device KIT    . Cod Liver Oil 1000 MG CAPS Take by mouth.   Marland Kitchen glipiZIDE (GLUCOTROL XL) 5 MG 24 hr tablet Take 1 tablet by mouth once daily   . lisinopril-hydrochlorothiazide (ZESTORETIC) 20-25 MG tablet Take 1 tablet by mouth once daily   . metFORMIN (GLUCOPHAGE-XR) 750 MG 24 hr tablet TAKE 2 TABLETS BY MOUTH ONCE DAILY WITH BREAKFAST    No facility-administered encounter medications on file as of 10/30/2020.   Financial Resource Strain: Low Risk   . Difficulty of Paying Living Expenses: Not hard at all    Current Diagnosis/Assessment:  Goals Addressed   None    Diabetes   Recent Relevant Labs: Lab Results  Component Value Date/Time   HGBA1C 13.6 (H) 01/18/2020 04:02 PM   HGBA1C 7.4 (A) 06/06/2019 09:30 AM   HGBA1C 6.8 (A) 10/24/2018 09:26 AM   HGBA1C 10.6 (A) 07/19/2018 11:43 AM   HGBA1C >14.0 06/08/2018 09:37 AM   HGBA1C 14.5 (H) 04/20/2016 03:08 PM   HGBA1C 7.2 04/10/2013 12:00 AM   MICROALBUR 4.7 01/18/2020 04:02 PM   MICROALBUR 0.9 10/24/2018 10:13 AM   MICROALBUR 100 04/20/2016 12:55 PM     Checking BG: Weekly  Recent FBG Readings: 119 - 120 Patient is currently uncontrolled on the following  medications: metformin, glipizide  We discussed:  Brings food to golf course now Significant dietary improvements  Plan  Continue current medications   Medication Management   Pt uses [facility] for all medications Uses pill box? Yes Pt endorses 100% compliance  We discussed:   Plan  Continue current medication management strategy  Follow up: 6 month phone visit  Milus Height, PharmD, [place], [facility] [phone]

## 2020-12-16 NOTE — Telephone Encounter (Signed)
lvm for pt to schedule an appt 

## 2021-04-08 ENCOUNTER — Telehealth: Payer: Self-pay

## 2021-04-08 NOTE — Progress Notes (Signed)
    Chronic Care Management Pharmacy Assistant   Name: Austin Ortega  MRN: [id number] DOB: 11-05-1951  Reason for Encounter:Diabetes Disease State Call.   Recent office visits:  No recent Office Visit  Recent consult visits:  No recent [facility] visits:  None in previous 6 months  Medications: Outpatient Encounter Medications as of 04/08/2021  Medication Sig Note  . amLODipine (NORVASC) 5 MG tablet Take 1 tablet by mouth once daily   . aspirin (ASPIRIN ADULT LOW DOSE) 81 MG EC tablet Take by mouth. 04/20/2016: Received from: [facility]  . atorvastatin (LIPITOR) 40 MG tablet Take 1 tablet by mouth once daily   . Blood Glucose Monitoring Suppl (FREESTYLE FREEDOM LITE) w/Device KIT    . Cod Liver Oil 1000 MG CAPS Take by mouth.   Marland Kitchen glipiZIDE (GLUCOTROL XL) 5 MG 24 hr tablet Take 1 tablet by mouth once daily   . lisinopril-hydrochlorothiazide (ZESTORETIC) 20-25 MG tablet Take 1 tablet by mouth once daily   . metFORMIN (GLUCOPHAGE-XR) 750 MG 24 hr tablet TAKE 2 TABLETS BY MOUTH ONCE DAILY WITH BREAKFAST    No facility-administered encounter medications on file as of 04/08/2021.    Star Rating Drugs: Atorvastatin 40 mg last filled  On 10/17/2020 for 90 day supply at [facility]. Glipizide 5 mg last filled on 05/01/2020 for 90 day supply at [facility]. Metformin 750 mg last filled on 09/19/2020 for 90 day supply at [facility].   Recent Relevant Labs: Lab Results  Component Value Date/Time   HGBA1C 13.6 (H) 01/18/2020 04:02 PM   HGBA1C 7.4 (A) 06/06/2019 09:30 AM   HGBA1C 6.8 (A) 10/24/2018 09:26 AM   HGBA1C 10.6 (A) 07/19/2018 11:43 AM   HGBA1C >14.0 06/08/2018 09:37 AM   HGBA1C 14.5 (H) 04/20/2016 03:08 PM   HGBA1C 7.2 04/10/2013 12:00 AM   MICROALBUR 4.7 01/18/2020 04:02 PM   MICROALBUR 0.9 10/24/2018 10:13 AM   MICROALBUR 100 04/20/2016 12:55 PM    Kidney Function Lab Results  Component Value Date/Time   CREATININE 1.84  (H) 01/18/2020 04:02 PM   CREATININE 1.33 (H) 10/24/2018 10:13 AM   GFRNONAA 37 (L) 01/18/2020 04:02 PM   GFRAA 42 (L) 01/18/2020 04:02 PM    . Current antihyperglycemic regimen:  o Glipizide 5 mg 1 tablet daily o Metformin 750 mg 2 tablet once daily  . What recent interventions/DTPs have been made to improve glycemic control:  o None ID . Have there been any recent hospitalizations or ED visits since last visit with CPP? No    Adherence Review: Is the patient currently on a STATIN medication? Yes Is the patient currently on ACE/ARB medication? No Does the patient have >5 day gap between last estimated fill dates? Yes   I have attempted without success to contact this patient by phone three times to do his Diabetes Disease State call. I left a Voice message for patient to return my call.  LVM 05/18,05/19,05/20  [place] Clinical Pharmacist Assistant [phone]

## 2021-04-09 ENCOUNTER — Ambulatory Visit: Payer: Medicare HMO

## 2021-04-21 ENCOUNTER — Telehealth: Payer: Self-pay

## 2021-04-21 NOTE — Progress Notes (Signed)
    Chronic Care Management Pharmacy Assistant   Name: Austin Ortega  MRN: [id number] DOB: Jun 11, 1951  Hospital visits:  None in previous 6 months  Medications: Outpatient Encounter Medications as of 04/21/2021  Medication Sig Note  . amLODipine (NORVASC) 5 MG tablet Take 1 tablet by mouth once daily   . aspirin (ASPIRIN ADULT LOW DOSE) 81 MG EC tablet Take by mouth. 04/20/2016: Received from: [facility]  . atorvastatin (LIPITOR) 40 MG tablet Take 1 tablet by mouth once daily   . Blood Glucose Monitoring Suppl (FREESTYLE FREEDOM LITE) w/Device KIT    . Cod Liver Oil 1000 MG CAPS Take by mouth.   Marland Kitchen glipiZIDE (GLUCOTROL XL) 5 MG 24 hr tablet Take 1 tablet by mouth once daily   . lisinopril-hydrochlorothiazide (ZESTORETIC) 20-25 MG tablet Take 1 tablet by mouth once daily   . metFORMIN (GLUCOPHAGE-XR) 750 MG 24 hr tablet TAKE 2 TABLETS BY MOUTH ONCE DAILY WITH BREAKFAST    No facility-administered encounter medications on file as of 04/21/2021.   Star Rating Drugs: Atorvastatin 40 mg last filled  On 10/17/2020 for 90 day supply at [facility]. Glipizide 5 mg last filled on 05/01/2020 for 90 day supply at [facility]. Metformin 750 mg last filled on 09/19/2020 for 90 day supply at [facility]. Lisinopril-HCTZ 20-25 mg last filled om 05/01/2020  for 90 day supply at [facility].  Reach out to [facility] to confirm patient last filled dates for Atorvastatin, Glipizide , metformin and Lisinopril-HCTZ 20-25 mg.  Per [place] , patient last filled dates were: Atorvastatin 40 mg last filled  On 10/17/2020 for 90 day supply at [facility]. Glipizide 5 mg last filled on 05/01/2020 for 90 day supply at [facility] Metformin 750 mg last filled on 09/19/2020 for 90 day supply at [facility]. Lisinopril-HCTZ 20-25 mg last filled om 05/01/2020  for 90 day supply at [facility].  [place] Pharmacist  Assistant [phone]

## 2021-05-20 ENCOUNTER — Other Ambulatory Visit: Payer: Self-pay | Admitting: Family Medicine

## 2021-05-20 DIAGNOSIS — I10 Essential (primary) hypertension: Secondary | ICD-10-CM

## 2021-05-20 DIAGNOSIS — R809 Proteinuria, unspecified: Secondary | ICD-10-CM

## 2021-05-20 NOTE — Telephone Encounter (Signed)
Last seen 3.10.2021 no upcoming appts

## 2021-05-20 NOTE — Telephone Encounter (Signed)
Lvm for pt to call and schedule an appt . Pts last appt was 01/2020

## 2021-05-20 NOTE — Telephone Encounter (Signed)
Notes to clinic:  script requested have expired Patient is due for office visit Vm left for patient to contact office for appt    Requested Prescriptions  Pending Prescriptions Disp Refills   metFORMIN (GLUCOPHAGE-XR) 750 MG 24 hr tablet [Pharmacy Med Name: metFORMIN HCl ER 750 MG Oral Tablet Extended Release 24 Hour] 180 tablet 0    Sig: TAKE 2 TABLETS BY [place]      Endocrinology:  Diabetes - Biguanides Failed - 05/20/2021 11:22 AM      Failed - Cr in normal range and within 360 days    Creat  Date Value Ref Range Status  01/18/2020 1.84 (H) 0.70 - 1.25 mg/dL Final    Comment:    For patients >44 years of age, the reference limit for Creatinine is approximately 13% higher for people identified as African-American. .    Creatinine, Urine  Date Value Ref Range Status  01/18/2020 538 (H) 20 - 320 mg/dL Final    Comment:    Verified by repeat analysis. .           Failed - HBA1C is between 0 and 7.9 and within 180 days    Hemoglobin A1C  Date Value Ref Range Status  04/10/2013 7.2  Final   HbA1c, POC (controlled diabetic range)  Date Value Ref Range Status  06/06/2019 7.4 (A) 0.0 - 7.0 % Final   Hgb A1c MFr Bld  Date Value Ref Range Status  01/18/2020 13.6 (H) <5.7 % of total Hgb Final    Comment:    For someone without known diabetes, a hemoglobin A1c value of 6.5% or greater indicates that they may have  diabetes and this should be confirmed with a follow-up  test. . For someone with known diabetes, a value <7% indicates  that their diabetes is well controlled and a value  greater than or equal to 7% indicates suboptimal  control. A1c targets should be individualized based on  duration of diabetes, age, comorbid conditions, and  other considerations. . Currently, no consensus exists regarding use of hemoglobin A1c for diagnosis of diabetes for children. .           Failed - AA eGFR in normal range and within 360 days     GFR, Est African American  Date Value Ref Range Status  01/18/2020 42 (L) > OR = 60 mL/min/1.85m Final   GFR, Est Non African American  Date Value Ref Range Status  01/18/2020 37 (L) > OR = 60 mL/min/1.754mFinal          Failed - Valid encounter within last 6 months    Recent Outpatient Visits           1 year ago [facility]controlled type [facility]   1 year ago Benign essential HTN   [facility]   1 year ago Type 2 diabetes mellitus with microalbuminuria, without long-term current use of insulin ([facility]   2 years ago Type 2 diabetes mellitus with microalbuminuria, without long-term current use of insulin ([facility]   2 years ago Type 2 diabetes mellitus with microalbuminuria, without long-term current use of insulin ([facility]  [facility]  amLODipine (NORVASC) 5 MG tablet [Pharmacy Med Name: amLODIPine Besylate 5 MG Oral Tablet] 90 tablet 0    Sig: Take 1 tablet by mouth once daily      Cardiovascular:  Calcium Channel Blockers Failed - 05/20/2021 11:22 AM      Failed - Last BP in normal range    BP Readings from Last 1 Encounters:  03/13/20 (!) 143/78          Failed - Valid encounter within last 6 months    Recent Outpatient Visits           1 year ago Uncontrolled type 2 diabetes mellitus with hyperglycemia [facility])   [facility] Steele Sizer, MD   1 year ago Benign essential HTN   [facility] [place], Drue Stager, MD   1 year ago Type 2 diabetes mellitus with microalbuminuria, without long-term current use of insulin [facility])   [facility] Steele Sizer, MD   2 years ago Type 2 diabetes mellitus with microalbuminuria, without  long-term current use of insulin [facility])   [facility] [place], Drue Stager, MD   2 years ago Type 2 diabetes mellitus with microalbuminuria, without long-term current use of insulin [facility])   [facility] [place], Drue Stager, MD                  glipiZIDE (GLUCOTROL XL) 5 MG 24 hr tablet [Pharmacy Med Name: glipiZIDE ER 5 MG Oral Tablet Extended Release 24 Hour] 90 tablet 0    Sig: Take 1 tablet by mouth once daily      Endocrinology:  Diabetes - Sulfonylureas Failed - 05/20/2021 11:22 AM      Failed - HBA1C is between 0 and 7.9 and within 180 days    Hemoglobin A1C  Date Value Ref Range Status  04/10/2013 7.2  Final   HbA1c, POC (controlled diabetic range)  Date Value Ref Range Status  06/06/2019 7.4 (A) 0.0 - 7.0 % Final   Hgb A1c MFr Bld  Date Value Ref Range Status  01/18/2020 13.6 (H) <5.7 % of total Hgb Final    Comment:    For someone without known diabetes, a hemoglobin A1c value of 6.5% or greater indicates that they may have  diabetes and this should be confirmed with a follow-up  test. . For someone with known diabetes, a value <7% indicates  that their diabetes is well controlled and a value  greater than or equal to 7% indicates suboptimal  control. A1c targets should be individualized based on  duration of diabetes, age, comorbid conditions, and  other considerations. . Currently, no consensus exists regarding use of hemoglobin A1c for diagnosis of diabetes for children. .           Failed - Valid encounter within last 6 months    Recent Outpatient Visits           1 year ago Uncontrolled type 2 diabetes mellitus with hyperglycemia [facility])   [facility] Steele Sizer, MD   1 year ago Benign essential HTN   [facility] [place], Drue Stager, MD   1 year ago Type 2 diabetes mellitus with microalbuminuria, without long-term current use of insulin [facility])   [facility]  [place], Drue Stager, MD   2 years ago Type 2 diabetes mellitus with microalbuminuria, without long-term current use of insulin [facility])   [facility] Steele Sizer, MD   2 years ago Type 2  diabetes mellitus with microalbuminuria, without long-term current use of insulin [facility])   [facility] Steele Sizer, MD

## 2021-06-03 ENCOUNTER — Telehealth: Payer: Self-pay

## 2021-06-03 NOTE — Progress Notes (Signed)
Chronic Care Management Pharmacy Assistant   Name: Markon Jares  MRN: [id number] DOB: [birth year]  Reason for Encounter:  Diabetes Disease State Call      Recent office visits:  In the last 6 months the patient has had no recent visits  Recent consult visits:  In the last 6 months the patient has had no consult visits  Hospital visits:  None in previous 6 months  Medications: Outpatient Encounter Medications as of 06/03/2021  Medication Sig Note   amLODipine (NORVASC) 5 MG tablet Take 1 tablet by mouth once daily    aspirin (ASPIRIN ADULT LOW DOSE) 81 MG EC tablet Take by mouth. 04/20/2016: Received from: [facility]   atorvastatin (LIPITOR) 40 MG tablet Take 1 tablet by mouth once daily    Blood Glucose Monitoring Suppl (FREESTYLE FREEDOM LITE) w/Device KIT     Cod Liver Oil 1000 MG CAPS Take by mouth.    glipiZIDE (GLUCOTROL XL) 5 MG 24 hr tablet Take 1 tablet by mouth once daily    lisinopril-hydrochlorothiazide (ZESTORETIC) 20-25 MG tablet Take 1 tablet by mouth once daily    metFORMIN (GLUCOPHAGE-XR) 750 MG 24 hr tablet TAKE 2 TABLETS BY MOUTH ONCE DAILY WITH BREAKFAST    No facility-administered encounter medications on file as of 06/03/2021.    Care Gaps: COVID-19 Vaccine, OPHTHALMOLOGY EXAM (Yearly), Zoster Vaccines- Shingrix , HEMOGLOBIN A1C, FOOT EXAM  Star Rating Drugs: Metformin 750 mg last filled on 09/19/2020 for a 90-Day supply at [facility] Atorvastatin 40 mg last filled on 10/14/2020 for a 90-Day supply at [place] 20-25 mg last filled on 10/17/2020 for a 90-Day supply at [place] Glipizide 5 mg last filled on 05/01/2020 for a 90-Day supply at [place]: Lab Results  Component Value Date/Time   HGBA1C 13.6 (H) 01/18/2020 04:02 PM   HGBA1C 7.4 (A) 06/06/2019 09:30 AM   HGBA1C 6.8 (A) 10/24/2018 09:26 AM   HGBA1C 10.6 (A) 07/19/2018 11:43 AM   HGBA1C  >14.0 06/08/2018 09:37 AM   HGBA1C 14.5 (H) 04/20/2016 03:08 PM   HGBA1C 7.2 04/10/2013 12:00 AM   MICROALBUR 4.7 01/18/2020 04:02 PM   MICROALBUR 0.9 10/24/2018 10:13 AM   MICROALBUR 100 04/20/2016 12:55 PM    Kidney Function Lab Results  Component Value Date/Time   CREATININE 1.84 (H) 01/18/2020 04:02 PM   CREATININE 1.33 (H) 10/24/2018 10:13 AM   GFRNONAA 37 (L) 01/18/2020 04:02 PM   GFRAA 42 (L) 01/18/2020 04:02 PM    Current antihyperglycemic regimen:  Metformin 750 mg 2 tables daily Glipizide 5 mg 1 tablet daily  Adherence Review: Is the patient currently on a STATIN medication? Yes Is the patient currently on ACE/ARB medication? Yes Does the patient have >5 day gap between last estimated fill dates? Yes  Per [place] , patient last filled dates were: Atorvastatin 40 mg last filled  On 10/17/2020 for 90 day supply at [facility]. Glipizide 5 mg last filled on 05/01/2020 for 90 day supply at [facility]. Metformin 750 mg last filled on 09/19/2020 for 90 day supply at [facility]. Lisinopril-HCTZ 20-25 mg last filled om 05/01/2020  for 90 day supply at [facility].    07/13 LVM requesting patient return my call. Also, called the patients home phone and spoke with his wife whom stated the patient was not available but she would give him my phone number and have him call me. 06/04/2021 LVM requesting patient to return my call 06/05/2021  LVM requesting patient to return my call  I have attempted without success to contact this patient by phone three times to do his Monthly Disease State Call and schedule him with CPP. I left a Voice message for patient to return my call.  Sent task to CPP for next steps to be that he contacts the patient from the Primary Providers Phone to see if he can successfully reach patient.   Lynann Bologna, CPA/CMA Clinical Pharmacist Assistant Phone: [phone]

## 2021-07-08 NOTE — Progress Notes (Signed)
Name: Austin Ortega   MRN: [id number]    DOB: July 27, 1951   Date:07/10/2021       Progress Note  Subjective  Chief Complaint  Medication Management   HPI  Diabetes: he has not been compliant with follow ups, last  visit was 01/2020 and A1C was 13.6 %, today is 14 %. He denies polyphagia or polydipsia but has polyuria and nocturia. He has been out of Metformin but takes Glipizide occasionally. Not compliant with diet. Discussed importance of regular visits, healthy diet and the increase risk of complications form elevated glucose. He is agreeing on trying Claris Che ( he denies family history of thyroid cancer or personal history of pancreatitis) , starting at 16 units and will ask Junius Argyle  ( pharmacist) to titrate dose with him . He has associated dyslipidemia, HTN and chronic kidney disease. Continue ACE, resume statin therapy and better diabetic control   Mild anemia: he had colonoscopy in 2021 that showed multiple polyps. We will recheck labs.   CKI stage III we will recheck labs today, he still has good urine output, taking ACE and denies pruritis   Patient Active Problem List   Diagnosis Date Noted   Non compliance with medical treatment 03/54/6568   Umbilical hernia without obstruction and without gangrene 04/21/2016   Vitiligo 04/21/2016   Decreased libido 03/04/2009   Pure hypercholesterolemia 10/01/2008   Benign essential HTN 08/27/2008   Type 2 diabetes mellitus with microalbuminuria ([place] [place]) 08/27/2008    Past Surgical History:  Procedure Laterality Date   COLONOSCOPY WITH PROPOFOL N/A 03/13/2020   Procedure: COLONOSCOPY WITH PROPOFOL;  Surgeon: Jonathon Bellows, MD;  Location: [facility] ENDOSCOPY;  Service: Gastroenterology;  Laterality: N/A;   NO PAST SURGERIES      Family History  Problem Relation Age of Onset   Diabetes Mother    Hypertension Mother    Cancer Father        Lung   Hypertension Sister    Diabetes Sister    Hypertension Brother    Hypertension Brother     Heart attack Brother     Social History   Tobacco Use   Smoking status: Former    Packs/day: 0.50    Years: 3.00    Pack years: 1.50    Types: Cigarettes    Start date: 04/20/1978    Quit date: 04/20/1981    Years since quitting: 40.2   Smokeless tobacco: Never   Tobacco comments:    more than 40 years ago  Substance Use Topics   Alcohol use: No    Alcohol/week: 0.0 standard drinks     Current Outpatient Medications:    aspirin 81 MG EC tablet, Take by mouth., Disp: , Rfl:    Cod Liver Oil 1000 MG CAPS, Take by mouth., Disp: , Rfl:    Continuous Blood Gluc Sensor (FREESTYLE LIBRE 2 SENSOR) MISC, 1 each by Does not apply route daily., Disp: 2 each, Rfl: 5   Insulin Degludec-Liraglutide (XULTOPHY) 100-3.6 UNIT-MG/ML SOPN, Inject 16-50 Units into the skin daily with breakfast., Disp: 15 mL, Rfl: 0   Insulin Pen Needle 32G X 6 MM MISC, 1 each by Does not apply route daily., Disp: 100 each, Rfl: 1   atorvastatin (LIPITOR) 40 MG tablet, Take 1 tablet (40 mg total) by mouth daily., Disp: 90 tablet, Rfl: 0   Blood Glucose Monitoring Suppl (FREESTYLE FREEDOM LITE) w/Device KIT, , Disp: , Rfl:    lisinopril-hydrochlorothiazide (ZESTORETIC) 20-25 MG tablet, Take 1 tablet by mouth daily., Disp:  90 tablet, Rfl: 0   metFORMIN (GLUCOPHAGE-XR) 750 MG 24 hr tablet, TAKE 2 TABLETS BY MOUTH ONCE DAILY WITH BREAKFAST, Disp: 180 tablet, Rfl: 0  Allergies  Allergen Reactions   Shrimp [Shellfish Allergy] Swelling    I personally reviewed active problem list, medication list, allergies, family history, social history, health maintenance with the patient/caregiver today.   ROS  Constitutional: Negative for fever or weight change.  Respiratory: Negative for cough and shortness of breath.   Cardiovascular: Negative for chest pain or palpitations.  Gastrointestinal: Negative for abdominal pain, no bowel changes.  Musculoskeletal: Negative for gait problem or joint swelling.  Skin: Negative for  rash.  Neurological: Negative for dizziness or headache.  No other specific complaints in a complete review of systems (except as listed in HPI above).   Objective  Vitals:   07/10/21 1127  BP: 138/88  Pulse: 98  Resp: 18  Temp: 98.2 F (36.8 C)  TempSrc: Oral  SpO2: 99%  Weight: 226 lb (102.5 kg)  Height: '6\' 1"'  (1.854 m)    Body mass index is 29.82 kg/m.  Physical Exam  Constitutional: Patient appears well-developed and well-nourished.  No distress.  HEENT: head atraumatic, normocephalic, pupils equal and reactive to light, neck supple Cardiovascular: Normal rate, regular rhythm and normal heart sounds.  No murmur heard. No BLE edema. Pulmonary/Chest: Effort normal and breath sounds normal. No respiratory distress. Abdominal: Soft.  There is no tenderness. Psychiatric: Patient has a normal mood and affect. behavior is normal. Judgment and thought content normal.   Recent Results (from the past 2160 hour(s))  POCT HgB A1C     Status: Abnormal   Collection Time: 07/10/21 11:40 AM  Result Value Ref Range   Hemoglobin A1C 14.0 (A) 4.0 - 5.6 %   HbA1c POC (<> result, manual entry)     HbA1c, POC (prediabetic range)     HbA1c, POC (controlled diabetic range)       PHQ2/9: Depression screen [facility] 2/9 07/10/2021 04/08/2020 01/30/2020 01/18/2020 06/06/2019  Decreased Interest 0 0 0 0 0  Down, Depressed, Hopeless 0 0 0 0 0  PHQ - 2 Score 0 0 0 0 0  Altered sleeping - - 0 0 0  Tired, decreased energy - - 0 0 1  Change in appetite - - 0 0 2  Feeling bad or failure about yourself  - - 0 0 0  Trouble concentrating - - 0 0 0  Moving slowly or fidgety/restless - - 0 0 0  Suicidal thoughts - - 0 0 0  PHQ-9 Score - - 0 0 3  Difficult doing work/chores - - - - Not difficult at all    phq 9 is negative   Fall Risk: Fall Risk  07/10/2021 04/08/2020 01/30/2020 01/18/2020 06/06/2019  Falls in the past year? 0 0 0 0 0  Number falls in past yr: 0 0 0 0 0  Injury with Fall? 0 0 0 0 0   Risk for fall due to : - No Fall Risks - - -  Risk for fall due to: Comment - - - - -  Follow up Falls evaluation completed Falls prevention discussed - - -     Functional Status Survey: Is the patient deaf or have difficulty hearing?: No Does the patient have difficulty seeing, even when wearing glasses/contacts?: No Does the patient have difficulty concentrating, remembering, or making decisions?: No Does the patient have difficulty walking or climbing stairs?: No Does the patient have difficulty dressing  or bathing?: No Does the patient have difficulty doing errands alone such as visiting a doctor's office or shopping?: No    Assessment & Plan  1. Type 2 diabetes mellitus with microalbuminuria, without long-term current use of insulin (HCC)  - POCT HgB A1C - HM Diabetes Foot Exam - Microalbumin / creatinine urine ratio - COMPLETE METABOLIC PANEL WITH GFR - metFORMIN (GLUCOPHAGE-XR) 750 MG 24 hr tablet; TAKE 2 TABLETS BY MOUTH ONCE DAILY WITH BREAKFAST  Dispense: 180 tablet; Refill: 0 - lisinopril-hydrochlorothiazide (ZESTORETIC) 20-25 MG tablet; Take 1 tablet by mouth daily.  Dispense: 90 tablet; Refill: 0  2. Benign essential HTN  - lisinopril-hydrochlorothiazide (ZESTORETIC) 20-25 MG tablet; Take 1 tablet by mouth daily.  Dispense: 90 tablet; Refill: 0  3. Uncontrolled type 2 diabetes mellitus with hyperglycemia ([place])   4. Anemia, unspecified type   5. Dyslipidemia  - Lipid panel - atorvastatin (LIPITOR) 40 MG tablet; Take 1 tablet (40 mg total) by mouth daily.  Dispense: 90 tablet; Refill: 0  6. Chronic kidney disease, stage 3b (HCC)  - Microalbumin / creatinine urine ratio - COMPLETE METABOLIC PANEL WITH GFR - CBC with Differential/Platelet - VITAMIN D 25 Hydroxy (Vit-D Deficiency, Fractures) - Parathyroid hormone, intact (no Ca) - Phosphorus  7. Non compliance with medical treatment   8. Dyslipidemia associated with type 2 diabetes mellitus (HCC)  -  atorvastatin (LIPITOR) 40 MG tablet; Take 1 tablet (40 mg total) by mouth daily.  Dispense: 90 tablet; Refill: 0

## 2021-07-10 ENCOUNTER — Encounter: Payer: Self-pay | Admitting: Family Medicine

## 2021-07-10 ENCOUNTER — Other Ambulatory Visit: Payer: Self-pay

## 2021-07-10 ENCOUNTER — Ambulatory Visit (INDEPENDENT_AMBULATORY_CARE_PROVIDER_SITE_OTHER): Payer: Medicare HMO | Admitting: Family Medicine

## 2021-07-10 VITALS — BP 138/88 | HR 98 | Temp 98.2°F | Resp 18 | Ht 73.0 in | Wt 226.0 lb

## 2021-07-10 DIAGNOSIS — Z9119 Patient's noncompliance with other medical treatment and regimen: Secondary | ICD-10-CM | POA: Diagnosis not present

## 2021-07-10 DIAGNOSIS — E1165 Type 2 diabetes mellitus with hyperglycemia: Secondary | ICD-10-CM | POA: Diagnosis not present

## 2021-07-10 DIAGNOSIS — N1832 Chronic kidney disease, stage 3b: Secondary | ICD-10-CM | POA: Diagnosis not present

## 2021-07-10 DIAGNOSIS — E1129 Type 2 diabetes mellitus with other diabetic kidney complication: Secondary | ICD-10-CM

## 2021-07-10 DIAGNOSIS — I10 Essential (primary) hypertension: Secondary | ICD-10-CM | POA: Diagnosis not present

## 2021-07-10 DIAGNOSIS — R809 Proteinuria, unspecified: Secondary | ICD-10-CM | POA: Diagnosis not present

## 2021-07-10 DIAGNOSIS — D649 Anemia, unspecified: Secondary | ICD-10-CM

## 2021-07-10 DIAGNOSIS — E785 Hyperlipidemia, unspecified: Secondary | ICD-10-CM

## 2021-07-10 DIAGNOSIS — E1169 Type 2 diabetes mellitus with other specified complication: Secondary | ICD-10-CM

## 2021-07-10 DIAGNOSIS — Z91199 Patient's noncompliance with other medical treatment and regimen due to unspecified reason: Secondary | ICD-10-CM

## 2021-07-10 LAB — POCT GLYCOSYLATED HEMOGLOBIN (HGB A1C): Hemoglobin A1C: 14 % — AB (ref 4.0–5.6)

## 2021-07-10 MED ORDER — XULTOPHY 100-3.6 UNIT-MG/ML ~~LOC~~ SOPN: 16.0000 [IU] | PEN_INJECTOR | Freq: Every day | SUBCUTANEOUS | 0 refills | Status: DC

## 2021-07-10 MED ORDER — INSULIN PEN NEEDLE 32G X 6 MM MISC: 1.0000 | Freq: Every day | 1 refills | Status: AC

## 2021-07-10 MED ORDER — ATORVASTATIN CALCIUM 40 MG PO TABS: 40.0000 mg | ORAL_TABLET | Freq: Every day | ORAL | 0 refills | Status: DC

## 2021-07-10 MED ORDER — FREESTYLE LIBRE 2 SENSOR MISC: 1.0000 | Freq: Every day | 5 refills | Status: DC

## 2021-07-10 MED ORDER — METFORMIN HCL ER 750 MG PO TB24: ORAL_TABLET | ORAL | 0 refills | Status: DC

## 2021-07-10 MED ORDER — LISINOPRIL-HYDROCHLOROTHIAZIDE 20-25 MG PO TABS: 1.0000 | ORAL_TABLET | Freq: Every day | ORAL | 0 refills | Status: DC

## 2021-07-13 ENCOUNTER — Encounter: Payer: Self-pay | Admitting: Family Medicine

## 2021-07-13 DIAGNOSIS — N1832 Chronic kidney disease, stage 3b: Secondary | ICD-10-CM | POA: Insufficient documentation

## 2021-07-13 DIAGNOSIS — N2581 Secondary hyperparathyroidism of renal origin: Secondary | ICD-10-CM | POA: Insufficient documentation

## 2021-07-13 LAB — LIPID PANEL
Cholesterol: 144 mg/dL (ref <200)
HDL: 54 mg/dL (ref >=40)
LDL Cholesterol (Calc): 69 mg/dL (calc)
Non-HDL Cholesterol (Calc): 90 mg/dL (calc) (ref <130)
Total CHOL/HDL Ratio: 2.7 (calc) (ref <5.0)
Triglycerides: 131 mg/dL (ref <150)

## 2021-07-13 LAB — CBC WITH DIFFERENTIAL/PLATELET
Absolute Monocytes: 506 cells/uL (ref 200–950)
Basophils Absolute: 46 cells/uL (ref 0–200)
Basophils Relative: 0.5 %
Eosinophils Absolute: 166 cells/uL (ref 15–500)
Eosinophils Relative: 1.8 %
HCT: 43.6 % (ref 38.5–50.0)
Hemoglobin: 13.6 g/dL (ref 13.2–17.1)
Lymphs Abs: 2079 cells/uL (ref 850–3900)
MCH: 27.4 pg (ref 27.0–33.0)
MCHC: 31.2 g/dL — ABNORMAL LOW (ref 32.0–36.0)
MCV: 87.9 fL (ref 80.0–100.0)
MPV: 10.6 fL (ref 7.5–12.5)
Monocytes Relative: 5.5 %
Neutro Abs: 6403 cells/uL (ref 1500–7800)
Neutrophils Relative %: 69.6 %
Platelets: 245 10*3/uL (ref 140–400)
RBC: 4.96 10*6/uL (ref 4.20–5.80)
RDW: 12.9 % (ref 11.0–15.0)
Total Lymphocyte: 22.6 %
WBC: 9.2 10*3/uL (ref 3.8–10.8)

## 2021-07-13 LAB — COMPLETE METABOLIC PANEL WITH GFR
AG Ratio: 1.3 (calc) (ref 1.0–2.5)
ALT: 13 U/L (ref 9–46)
AST: 9 U/L — ABNORMAL LOW (ref 10–35)
Albumin: 4.1 g/dL (ref 3.6–5.1)
Alkaline phosphatase (APISO): 93 U/L (ref 35–144)
BUN/Creatinine Ratio: 13 (calc) (ref 6–22)
BUN: 30 mg/dL — ABNORMAL HIGH (ref 7–25)
CO2: 26 mmol/L (ref 20–32)
Calcium: 9.9 mg/dL (ref 8.6–10.3)
Chloride: 99 mmol/L (ref 98–110)
Creat: 2.23 mg/dL — ABNORMAL HIGH (ref 0.70–1.28)
Globulin: 3.1 g/dL (calc) (ref 1.9–3.7)
Glucose, Bld: 354 mg/dL — ABNORMAL HIGH (ref 65–99)
Potassium: 4.9 mmol/L (ref 3.5–5.3)
Sodium: 135 mmol/L (ref 135–146)
Total Bilirubin: 0.9 mg/dL (ref 0.2–1.2)
Total Protein: 7.2 g/dL (ref 6.1–8.1)
eGFR: 31 mL/min/{1.73_m2} — ABNORMAL LOW (ref >=60)

## 2021-07-13 LAB — PARATHYROID HORMONE, INTACT (NO CA): PTH: 98 pg/mL — ABNORMAL HIGH (ref 16–77)

## 2021-07-13 LAB — VITAMIN D 25 HYDROXY (VIT D DEFICIENCY, FRACTURES): Vit D, 25-Hydroxy: 22 ng/mL — ABNORMAL LOW (ref 30–100)

## 2021-07-13 LAB — PHOSPHORUS: Phosphorus: 3.7 mg/dL (ref 2.1–4.3)

## 2021-07-14 ENCOUNTER — Telehealth: Payer: Self-pay | Admitting: *Deleted

## 2021-07-14 DIAGNOSIS — E785 Hyperlipidemia, unspecified: Secondary | ICD-10-CM | POA: Diagnosis not present

## 2021-07-14 DIAGNOSIS — N1832 Chronic kidney disease, stage 3b: Secondary | ICD-10-CM | POA: Diagnosis not present

## 2021-07-14 DIAGNOSIS — E1129 Type 2 diabetes mellitus with other diabetic kidney complication: Secondary | ICD-10-CM | POA: Diagnosis not present

## 2021-07-14 DIAGNOSIS — R809 Proteinuria, unspecified: Secondary | ICD-10-CM | POA: Diagnosis not present

## 2021-07-14 NOTE — Chronic Care Management (AMB) (Signed)
  Care Management   Note  07/14/2021 Name: Talyn Duffner MRN: [id number] DOB: 08/16/1951  [name] is a 70 y.o. year old male who is a primary care patient of Steele Sizer, MD and is actively engaged with the care management team. I reached out to [name] by phone today to assist with scheduling a follow up visit with the Pharmacist  Follow up plan: Unsuccessful telephone outreach attempt made. A HIPAA compliant phone message was left for the patient providing contact information and requesting a return call.   Julian Hy, [place] Management  Direct Dial: [phone]

## 2021-07-15 LAB — MICROALBUMIN / CREATININE URINE RATIO
Creatinine, Urine: 188 mg/dL (ref 20–320)
Microalb Creat Ratio: 5 mcg/mg creat (ref <30)
Microalb, Ur: 0.9 mg/dL

## 2021-07-16 NOTE — Chronic Care Management (AMB) (Signed)
  Care Management   Note  07/16/2021 Name: Austin Ortega MRN: [id number] DOB: [birth year]  Austin Ortega is a 70 y.o. year old male who is a primary care patient of Steele Sizer, MD and is actively engaged with the care management team. I reached out to Austin Ortega by phone today to assist with re-scheduling a follow up visit with the Pharmacist  Follow up plan: 2nd attempt Unsuccessful telephone outreach attempt made. A HIPAA compliant phone message was left for the patient providing contact information and requesting a return call.   Julian Hy, [place] Management  Direct Dial: [phone]

## 2021-07-22 ENCOUNTER — Telehealth: Payer: Self-pay | Admitting: Family Medicine

## 2021-07-22 NOTE — Telephone Encounter (Signed)
Copied from [place] [phone] [id number]. Topic: Medicare AWV >> Jul 22, 2021 12:33 PM Cher Nakai R wrote: Reason for CRM:  Left message for patient to call back and schedule Medicare Annual Wellness Visit (AWV) in office.   If unable to come into the office for AWV,  please offer to do virtually or by telephone.  Last AWV: 04/08/2020  Please schedule at anytime with [place].  40 minute appointment  Any questions, please contact me at [phone]

## 2021-07-23 NOTE — Chronic Care Management (AMB) (Signed)
  Care Management   Note  07/23/2021 Name: Austin Ortega MRN: [id number] DOB: 1951-02-05  [name] is a 70 y.o. year old male who is a primary care patient of Steele Sizer, MD and is actively engaged with the care management team. I reached out to [name] by phone today to assist with re-scheduling a follow up visit with the Pharmacist  Follow up plan: We have been unable to make contact with the patient for follow up.    Julian Hy, [place] Management  Direct Dial: [phone]

## 2021-08-03 ENCOUNTER — Telehealth: Payer: Self-pay | Admitting: Family Medicine

## 2021-08-03 NOTE — Telephone Encounter (Signed)
Copied from [place] [id number]. Topic: Medicare AWV >> Aug 03, 2021  1:03 PM Cher Nakai R wrote: Reason for CRM:  Left message for patient to call back and schedule Medicare Annual Wellness Visit (AWV) in office.   If unable to come into the office for AWV,  please offer to do virtually or by telephone.  Last AWV: 04/08/2020  Please schedule at anytime with [place].  40 minute appointment  Any questions, please contact me at [phone]

## 2021-08-17 ENCOUNTER — Telehealth: Payer: Self-pay | Admitting: Family Medicine

## 2021-08-17 NOTE — Telephone Encounter (Signed)
Copied from [place] [id number]. Topic: Medicare AWV >> Aug 17, 2021 11:23 AM Cher Nakai R wrote: Reason for CRM:  Left message for patient to call back and schedule Medicare Annual Wellness Visit (AWV) in office.   If unable to come into the office for AWV,  please offer to do virtually or by telephone.  Last AWV: 04/08/2020  Please schedule at anytime with [place].  40 minute appointment  Any questions, please contact me at [phone]

## 2021-09-02 ENCOUNTER — Other Ambulatory Visit: Payer: Self-pay | Admitting: Family Medicine

## 2021-09-02 ENCOUNTER — Telehealth: Payer: Self-pay

## 2021-09-02 MED ORDER — FREESTYLE LIBRE 2 SENSOR MISC: 1.0000 | Freq: Every day | 5 refills | Status: DC

## 2021-09-02 MED ORDER — FREESTYLE FREEDOM LITE W/DEVICE KIT: 1.0000 | PACK | Freq: Two times a day (BID) | 0 refills | Status: DC

## 2021-09-02 NOTE — Telephone Encounter (Signed)
Requesting the freestyle monitoring kit for his arm. Please send to [facility]

## 2021-09-09 NOTE — Progress Notes (Signed)
Name: Austin Ortega   MRN: [id number]    DOB: October 14, 1951   Date:09/10/2021       Progress Note  Subjective  Chief Complaint  Follow Up  HPI  Diabetes: he was seen in August 2022 after a lapse in visits and medications and A1C was very high at 14 %, he was feeling tired, thirsty and voiding more than usual. He agreed on starting Xultophy and is now taking Metformin daily, also Lisinopril hctz and atorvastatin. He was checking glucose with Free style but did not get the rx filled. He does not like checking FSBS. He gained some weight since last visit, congratulated him on his effort, but also discussed diet and needs to titrate dose of Xultophy as prescribed   HTN: continue medication, bp is at goal , denies chest pain or palpitation   Dyslipidemia: continue statin therapy  LDL at goal   CKI stage III with secondary hyperparathyroidism: we will recheck level today and if GFR below 50 we will refer him to nephrologist. urine micro negative, , denies pruritis   Patient Active Problem List   Diagnosis Date Noted   Secondary hyperparathyroidism ([place]) 07/13/2021   Chronic kidney disease, stage 3b ([place]) 07/13/2021   Non compliance with medical treatment 25/95/6387   Umbilical hernia without obstruction and without gangrene 04/21/2016   Vitiligo 04/21/2016   Decreased libido 03/04/2009   Pure hypercholesterolemia 10/01/2008   Benign essential HTN 08/27/2008   Type 2 diabetes mellitus with microalbuminuria ([place]) 08/27/2008    Past Surgical History:  Procedure Laterality Date   COLONOSCOPY WITH PROPOFOL N/A 03/13/2020   Procedure: COLONOSCOPY WITH PROPOFOL;  Surgeon: Jonathon Bellows, MD;  Location: [facility] ENDOSCOPY;  Service: Gastroenterology;  Laterality: N/A;   NO PAST SURGERIES      Family History  Problem Relation Age of Onset   Diabetes Mother    Hypertension Mother    Cancer Father        Lung   Hypertension Sister    Diabetes Sister    Hypertension Brother    Hypertension Brother     Heart attack Brother     Social History   Tobacco Use   Smoking status: Former    Packs/day: 0.50    Years: 3.00    Pack years: 1.50    Types: Cigarettes    Start date: 04/20/1978    Quit date: 04/20/1981    Years since quitting: 40.4   Smokeless tobacco: Never   Tobacco comments:    more than 40 years ago  Substance Use Topics   Alcohol use: No    Alcohol/week: 0.0 standard drinks     Current Outpatient Medications:    aspirin 81 MG EC tablet, Take by mouth., Disp: , Rfl:    atorvastatin (LIPITOR) 40 MG tablet, Take 1 tablet (40 mg total) by mouth daily., Disp: 90 tablet, Rfl: 0   Cod Liver Oil 1000 MG CAPS, Take by mouth., Disp: , Rfl:    Continuous Blood Gluc Sensor (FREESTYLE LIBRE 2 SENSOR) MISC, 1 each by Does not apply route daily., Disp: 2 each, Rfl: 5   Insulin Degludec-Liraglutide (XULTOPHY) 100-3.6 UNIT-MG/ML SOPN, Inject 16-50 Units into the skin daily with breakfast., Disp: 15 mL, Rfl: 0   Insulin Pen Needle 32G X 6 MM MISC, 1 each by Does not apply route daily., Disp: 100 each, Rfl: 1   lisinopril-hydrochlorothiazide (ZESTORETIC) 20-25 MG tablet, Take 1 tablet by mouth daily., Disp: 90 tablet, Rfl: 0   metFORMIN (GLUCOPHAGE-XR) 750 MG 24  hr tablet, TAKE 2 TABLETS BY MOUTH ONCE DAILY WITH BREAKFAST, Disp: 180 tablet, Rfl: 0  Allergies  Allergen Reactions   Shrimp [Shellfish Allergy] Swelling    I personally reviewed active problem list, medication list, allergies, family history, social history, health maintenance with the patient/caregiver today.   ROS  Constitutional: Negative for fever , positive for  weight change.  Respiratory: Negative for cough and shortness of breath.   Cardiovascular: Negative for chest pain or palpitations.  Gastrointestinal: Negative for abdominal pain, no bowel changes.  Musculoskeletal: Negative for gait problem or joint swelling.  Skin: Negative for rash.  Neurological: Negative for dizziness or headache.  No other  specific complaints in a complete review of systems (except as listed in HPI above).   Objective  Vitals:   09/10/21 0951  BP: 126/80  Pulse: 89  Resp: 16  Temp: 98.3 F (36.8 C)  SpO2: 98%  Weight: 232 lb (105.2 kg)  Height: _0  (1.854 m)    Body mass index is 30.61 kg/m.  Physical Exam  Constitutional: Patient appears well-developed and well-nourished. Obese  No distress.  HEENT: head atraumatic, normocephalic, pupils equal and reactive to light, neck supple Cardiovascular: Normal rate, regular rhythm and normal heart sounds.  No murmur heard. No BLE edema. Pulmonary/Chest: Effort normal and breath sounds normal. No respiratory distress. Abdominal: Soft.  There is no tenderness. Psychiatric: Patient has a normal mood and affect. behavior is normal. Judgment and thought content normal.   Recent Results (from the past 2160 hour(s))  POCT HgB A1C     Status: Abnormal   Collection Time: 07/10/21 11:40 AM  Result Value Ref Range   Hemoglobin A1C 14.0 (A) 4.0 - 5.6 %   HbA1c POC (<> result, manual entry)     HbA1c, POC (prediabetic range)     HbA1c, POC (controlled diabetic range)    Lipid panel     Status: None   Collection Time: 07/10/21 12:16 PM  Result Value Ref Range   Cholesterol 144 <200 mg/dL   HDL 54 > OR = 40 mg/dL   Triglycerides 131 <150 mg/dL   LDL Cholesterol (Calc) 69 mg/dL (calc)    Comment: Reference range: <100 . Desirable range <100 mg/dL for primary prevention;   <70 mg/dL for patients with CHD or diabetic patients  with > or = 2 CHD risk factors. Marland Kitchen LDL-C is now calculated using the Martin-Hopkins  calculation, which is a validated novel method providing  better accuracy than the Friedewald equation in the  estimation of LDL-C.  Cresenciano Genre et al. Annamaria Helling. 3474;259(56): 2061-2068  (http://education.[facility].com/faq/FAQ164)    Total CHOL/HDL Ratio 2.7 <5.0 (calc)   Non-HDL Cholesterol (Calc) 90 <130 mg/dL (calc)    Comment: For patients  with diabetes plus 1 major ASCVD risk  factor, treating to a non-HDL-C goal of <100 mg/dL  (LDL-C of <70 mg/dL) is considered a therapeutic  option.   COMPLETE METABOLIC PANEL WITH GFR     Status: Abnormal   Collection Time: 07/10/21 12:16 PM  Result Value Ref Range   Glucose, Bld 354 (H) 65 - 99 mg/dL    Comment: .            Fasting reference interval . For someone without known diabetes, a glucose value >125 mg/dL indicates that they may have diabetes and this should be confirmed with a follow-up test. .    BUN 30 (H) 7 - 25 mg/dL   Creat 2.23 (H) 0.70 - 1.28 mg/dL  eGFR 31 (L) > OR = 60 mL/min/1.75m    Comment: The eGFR is based on the CKD-EPI 2021 equation. To calculate  the new eGFR from a previous Creatinine or Cystatin C result, go to https://www.kidney.org/professionals/ kdoqi/gfr%5Fcalculator    BUN/Creatinine Ratio 13 6 - 22 (calc)   Sodium 135 135 - 146 mmol/L   Potassium 4.9 3.5 - 5.3 mmol/L   Chloride 99 98 - 110 mmol/L   CO2 26 20 - 32 mmol/L   Calcium 9.9 8.6 - 10.3 mg/dL   Total Protein 7.2 6.1 - 8.1 g/dL   Albumin 4.1 3.6 - 5.1 g/dL   Globulin 3.1 1.9 - 3.7 g/dL (calc)   AG Ratio 1.3 1.0 - 2.5 (calc)   Total Bilirubin 0.9 0.2 - 1.2 mg/dL   Alkaline phosphatase (APISO) 93 35 - 144 U/L   AST 9 (L) 10 - 35 U/L   ALT 13 9 - 46 U/L  CBC with Differential/Platelet     Status: Abnormal   Collection Time: 07/10/21 12:16 PM  Result Value Ref Range   WBC 9.2 3.8 - 10.8 Thousand/uL   RBC 4.96 4.20 - 5.80 Million/uL   Hemoglobin 13.6 13.2 - 17.1 g/dL   HCT 43.6 38.5 - 50.0 %   MCV 87.9 80.0 - 100.0 fL   MCH 27.4 27.0 - 33.0 pg   MCHC 31.2 (L) 32.0 - 36.0 g/dL   RDW 12.9 11.0 - 15.0 %   Platelets 245 140 - 400 Thousand/uL   MPV 10.6 7.5 - 12.5 fL   Neutro Abs 6,403 1,500 - 7,800 cells/uL   Lymphs Abs 2,079 850 - 3,900 cells/uL   Absolute Monocytes 506 200 - 950 cells/uL   Eosinophils Absolute 166 15 - 500 cells/uL   Basophils Absolute 46 0 - 200  cells/uL   Neutrophils Relative % 69.6 %   Total Lymphocyte 22.6 %   Monocytes Relative 5.5 %   Eosinophils Relative 1.8 %   Basophils Relative 0.5 %  VITAMIN D 25 Hydroxy (Vit-D Deficiency, Fractures)     Status: Abnormal   Collection Time: 07/10/21 12:16 PM  Result Value Ref Range   Vit D, 25-Hydroxy 22 (L) 30 - 100 ng/mL    Comment: Vitamin D Status         25-OH Vitamin D: . Deficiency:                    <20 ng/mL Insufficiency:             20 - 29 ng/mL Optimal:                 > or = 30 ng/mL . For 25-OH Vitamin D testing on patients on  D2-supplementation and patients for whom quantitation  of D2 and D3 fractions is required, the [facility](TM) 25-OH VIT D, (D2,D3), LC/MS/MS is recommended: order  code [id number](patients >231yr. See Note 1 . Note 1 . For additional information, please refer to  http://education.[facility].com/faq/FAQ199  (This link is being provided for informational/ educational purposes only.)   Parathyroid hormone, intact (no Ca)     Status: Abnormal   Collection Time: 07/10/21 12:16 PM  Result Value Ref Range   PTH 98 (H) 16 - 77 pg/mL    Comment: . Interpretive Guide    Intact PTH           Calcium ------------------    ----------           ------- Normal Parathyroid  Normal               Normal Hypoparathyroidism    Low or Low Normal    Low Hyperparathyroidism    Primary            Normal or High       High    Secondary          High                 Normal or Low    Tertiary           High                 High Non-Parathyroid    Hypercalcemia      Low or Low Normal    High .   Phosphorus     Status: None   Collection Time: 07/10/21 12:16 PM  Result Value Ref Range   Phosphorus 3.7 2.1 - 4.3 mg/dL  Microalbumin / creatinine urine ratio     Status: None   Collection Time: 07/14/21  8:00 AM  Result Value Ref Range   Creatinine, Urine 188 20 - 320 mg/dL   Microalb, Ur 0.9 mg/dL    Comment: Reference Range Not established     Microalb Creat Ratio 5 <30 mcg/mg creat    Comment: . The ADA defines abnormalities in albumin excretion as follows: Marland Kitchen Albuminuria Category        Result (mcg/mg creatinine) . Normal to Mildly increased   <30 Moderately increased         30-299  Severely increased           > OR = 300 . The ADA recommends that at least two of three specimens collected within a 3-6 month period be abnormal before considering a patient to be within a diagnostic category.      PHQ2/9: Depression screen [facility] 2/9 09/10/2021 07/10/2021 04/08/2020 01/30/2020 01/18/2020  Decreased Interest 0 0 0 0 0  Down, Depressed, Hopeless 0 0 0 0 0  PHQ - 2 Score 0 0 0 0 0  Altered sleeping 0 - - 0 0  Tired, decreased energy 0 - - 0 0  Change in appetite 0 - - 0 0  Feeling bad or failure about yourself  0 - - 0 0  Trouble concentrating 0 - - 0 0  Moving slowly or fidgety/restless 0 - - 0 0  Suicidal thoughts 0 - - 0 0  PHQ-9 Score 0 - - 0 0  Difficult doing work/chores - - - - -    phq 9 is negative   Fall Risk: Fall Risk  09/10/2021 07/10/2021 04/08/2020 01/30/2020 01/18/2020  Falls in the past year? 0 0 0 0 0  Number falls in past yr: 0 0 0 0 0  Injury with Fall? 0 0 0 0 0  Risk for fall due to : No Fall Risks - No Fall Risks - -  Risk for fall due to: Comment - - - - -  Follow up Falls prevention discussed Falls evaluation completed Falls prevention discussed - -      Functional Status Survey: Is the patient deaf or have difficulty hearing?: No Does the patient have difficulty seeing, even when wearing glasses/contacts?: No Does the patient have difficulty concentrating, remembering, or making decisions?: No Does the patient have difficulty walking or climbing stairs?: No Does the patient have difficulty dressing or bathing?: No Does the patient have difficulty doing errands alone  such as visiting a doctor's office or shopping?: No    Assessment & Plan  1. Uncontrolled type 2 diabetes mellitus with  hyperglycemia ([place])  Explained importance of adjusting dose of Xultophy   2. Type 2 diabetes mellitus with microalbuminuria, without long-term current use of insulin ([facility])  - lisinopril-hydrochlorothiazide (ZESTORETIC) 20-25 MG tablet; Take 1 tablet by mouth daily.  Dispense: 90 tablet; Refill: 0 - metFORMIN (GLUCOPHAGE-XR) 750 MG 24 hr tablet; TAKE 2 TABLETS BY MOUTH ONCE DAILY WITH BREAKFAST  Dispense: 180 tablet; Refill: 0 - Insulin Degludec-Liraglutide (XULTOPHY) 100-3.6 UNIT-MG/ML SOPN; Inject 16-50 Units into the skin daily with breakfast.  Dispense: 15 mL; Refill: 0  3. Benign essential HTN  - lisinopril-hydrochlorothiazide (ZESTORETIC) 20-25 MG tablet; Take 1 tablet by mouth daily.  Dispense: 90 tablet; Refill: 0  4. Dyslipidemia  - atorvastatin (LIPITOR) 40 MG tablet; Take 1 tablet (40 mg total) by mouth daily.  Dispense: 90 tablet; Refill: 0  5. Dyslipidemia associated with type 2 diabetes mellitus ([facility])  - atorvastatin (LIPITOR) 40 MG tablet; Take 1 tablet (40 mg total) by mouth daily.  Dispense: 90 tablet; Refill: 0   6. Needs flu shot  - Flu vaccine HIGH DOSE PF (Fluzone High dose)  7. Chronic kidney disease, stage 3b ([facility])  - BASIC METABOLIC PANEL WITH GFR  8. Secondary hyperparathyroidism of renal origin ([place])  - BASIC METABOLIC PANEL WITH GFR

## 2021-09-10 ENCOUNTER — Encounter: Payer: Self-pay | Admitting: Family Medicine

## 2021-09-10 ENCOUNTER — Ambulatory Visit (INDEPENDENT_AMBULATORY_CARE_PROVIDER_SITE_OTHER): Payer: Medicare HMO | Admitting: Family Medicine

## 2021-09-10 ENCOUNTER — Other Ambulatory Visit: Payer: Self-pay

## 2021-09-10 VITALS — BP 126/80 | HR 89 | Temp 98.3°F | Resp 16 | Ht 73.0 in | Wt 232.0 lb

## 2021-09-10 DIAGNOSIS — E1165 Type 2 diabetes mellitus with hyperglycemia: Secondary | ICD-10-CM

## 2021-09-10 DIAGNOSIS — R809 Proteinuria, unspecified: Secondary | ICD-10-CM

## 2021-09-10 DIAGNOSIS — E785 Hyperlipidemia, unspecified: Secondary | ICD-10-CM

## 2021-09-10 DIAGNOSIS — I10 Essential (primary) hypertension: Secondary | ICD-10-CM

## 2021-09-10 DIAGNOSIS — E1129 Type 2 diabetes mellitus with other diabetic kidney complication: Secondary | ICD-10-CM

## 2021-09-10 DIAGNOSIS — N2581 Secondary hyperparathyroidism of renal origin: Secondary | ICD-10-CM | POA: Diagnosis not present

## 2021-09-10 DIAGNOSIS — Z23 Encounter for immunization: Secondary | ICD-10-CM | POA: Diagnosis not present

## 2021-09-10 DIAGNOSIS — N1832 Chronic kidney disease, stage 3b: Secondary | ICD-10-CM

## 2021-09-10 DIAGNOSIS — E1169 Type 2 diabetes mellitus with other specified complication: Secondary | ICD-10-CM | POA: Diagnosis not present

## 2021-09-10 MED ORDER — ATORVASTATIN CALCIUM 40 MG PO TABS: 40.0000 mg | ORAL_TABLET | Freq: Every day | ORAL | 0 refills | Status: DC

## 2021-09-10 MED ORDER — LISINOPRIL-HYDROCHLOROTHIAZIDE 20-25 MG PO TABS: 1.0000 | ORAL_TABLET | Freq: Every day | ORAL | 0 refills | Status: DC

## 2021-09-10 MED ORDER — METFORMIN HCL ER 750 MG PO TB24: ORAL_TABLET | ORAL | 0 refills | Status: DC

## 2021-09-10 MED ORDER — XULTOPHY 100-3.6 UNIT-MG/ML ~~LOC~~ SOPN: 16.0000 [IU] | PEN_INJECTOR | Freq: Every day | SUBCUTANEOUS | 0 refills | Status: DC

## 2021-09-10 NOTE — Patient Instructions (Signed)
Please check fasting glucose every morning Go up on the Xultophy by 2 units every 3 days if sugar above 140, goal is to keep sugar in the morning between 100-140.  Max dose is 50 units per day

## 2021-09-11 LAB — BASIC METABOLIC PANEL WITH GFR
BUN/Creatinine Ratio: 12 (calc) (ref 6–22)
BUN: 16 mg/dL (ref 7–25)
CO2: 27 mmol/L (ref 20–32)
Calcium: 9.5 mg/dL (ref 8.6–10.3)
Chloride: 104 mmol/L (ref 98–110)
Creat: 1.3 mg/dL — ABNORMAL HIGH (ref 0.70–1.28)
Glucose, Bld: 189 mg/dL — ABNORMAL HIGH (ref 65–99)
Potassium: 4.1 mmol/L (ref 3.5–5.3)
Sodium: 140 mmol/L (ref 135–146)
eGFR: 59 mL/min/{1.73_m2} — ABNORMAL LOW (ref >=60)

## 2021-10-14 ENCOUNTER — Other Ambulatory Visit: Payer: Self-pay | Admitting: Family Medicine

## 2021-10-14 DIAGNOSIS — I10 Essential (primary) hypertension: Secondary | ICD-10-CM

## 2021-11-09 NOTE — Progress Notes (Deleted)
Name: Austin Ortega   MRN: [id number]    DOB: 1951/01/15   Date:11/09/2021       Progress Note  Subjective  Chief Complaint  Follow Up  HPI  Diabetes: he was seen in August 2022 after a lapse in visits and medications and A1C was very high at 14 %, he was feeling tired, thirsty and voiding more than usual. He agreed on starting Xultophy and is now taking Metformin daily, also Lisinopril hctz and atorvastatin. He was checking glucose with Free style but did not get the rx filled. He does not like checking FSBS. He gained some weight since last visit, congratulated him on his effort, but also discussed diet and needs to titrate dose of Xultophy as prescribed   HTN: continue medication, bp is at goal , denies chest pain or palpitation   Dyslipidemia: continue statin therapy  LDL at goal   CKI stage III with secondary hyperparathyroidism: we will recheck level today and if GFR below 50 we will refer him to nephrologist. urine micro negative, , denies pruritis   Patient Active Problem List   Diagnosis Date Noted   Secondary hyperparathyroidism ([place]) 07/13/2021   Chronic kidney disease, stage 3b ([place]) 07/13/2021   Non compliance with medical treatment 78/24/2353   Umbilical hernia without obstruction and without gangrene 04/21/2016   Vitiligo 04/21/2016   Decreased libido 03/04/2009   Pure hypercholesterolemia 10/01/2008   Benign essential HTN 08/27/2008   Type 2 diabetes mellitus with microalbuminuria ([place]) 08/27/2008    Past Surgical History:  Procedure Laterality Date   COLONOSCOPY WITH PROPOFOL N/A 03/13/2020   Procedure: COLONOSCOPY WITH PROPOFOL;  Surgeon: Jonathon Bellows, MD;  Location: [facility] ENDOSCOPY;  Service: Gastroenterology;  Laterality: N/A;   NO PAST SURGERIES      Family History  Problem Relation Age of Onset   Diabetes Mother    Hypertension Mother    Cancer Father        Lung   Hypertension Sister    Diabetes Sister    Hypertension Brother    Hypertension Brother     Heart attack Brother     Social History   Tobacco Use   Smoking status: Former    Packs/day: 0.50    Years: 3.00    Pack years: 1.50    Types: Cigarettes    Start date: 04/20/1978    Quit date: 04/20/1981    Years since quitting: 40.5   Smokeless tobacco: Never   Tobacco comments:    more than 40 years ago  Substance Use Topics   Alcohol use: No    Alcohol/week: 0.0 standard drinks     Current Outpatient Medications:    aspirin 81 MG EC tablet, Take by mouth., Disp: , Rfl:    atorvastatin (LIPITOR) 40 MG tablet, Take 1 tablet (40 mg total) by mouth daily., Disp: 90 tablet, Rfl: 0   Cod Liver Oil 1000 MG CAPS, Take by mouth., Disp: , Rfl:    Continuous Blood Gluc Sensor (FREESTYLE LIBRE 2 SENSOR) MISC, 1 each by Does not apply route daily., Disp: 2 each, Rfl: 5   Insulin Degludec-Liraglutide (XULTOPHY) 100-3.6 UNIT-MG/ML SOPN, Inject 16-50 Units into the skin daily with breakfast., Disp: 15 mL, Rfl: 0   Insulin Pen Needle 32G X 6 MM MISC, 1 each by Does not apply route daily., Disp: 100 each, Rfl: 1   lisinopril-hydrochlorothiazide (ZESTORETIC) 20-25 MG tablet, Take 1 tablet by mouth daily., Disp: 90 tablet, Rfl: 0   metFORMIN (GLUCOPHAGE-XR) 750 MG 24  hr tablet, TAKE 2 TABLETS BY MOUTH ONCE DAILY WITH BREAKFAST, Disp: 180 tablet, Rfl: 0  Allergies  Allergen Reactions   Shrimp [Shellfish Allergy] Swelling    I personally reviewed active problem list, medication list, allergies, family history, social history, health maintenance with the patient/caregiver today.   ROS  ***  Objective  There were no vitals filed for this visit.  There is no height or weight on file to calculate BMI.  Physical Exam ***  Recent Results (from the past 2160 hour(s))  BASIC METABOLIC PANEL WITH GFR     Status: Abnormal   Collection Time: 09/10/21 10:28 AM  Result Value Ref Range   Glucose, Bld 189 (H) 65 - 99 mg/dL    Comment: .            Fasting reference interval . For  someone without known diabetes, a glucose value >125 mg/dL indicates that they may have diabetes and this should be confirmed with a follow-up test. .    BUN 16 7 - 25 mg/dL   Creat 1.30 (H) 0.70 - 1.28 mg/dL   eGFR 59 (L) > OR = 60 mL/min/1.18m    Comment: The eGFR is based on the CKD-EPI 2021 equation. To calculate  the new eGFR from a previous Creatinine or Cystatin C result, go to https://www.kidney.org/professionals/ kdoqi/gfr%5Fcalculator    BUN/Creatinine Ratio 12 6 - 22 (calc)   Sodium 140 135 - 146 mmol/L   Potassium 4.1 3.5 - 5.3 mmol/L   Chloride 104 98 - 110 mmol/L   CO2 27 20 - 32 mmol/L   Calcium 9.5 8.6 - 10.3 mg/dL    [facility]9: [facility] 09/10/2021 07/10/2021 04/08/2020 01/30/2020 01/18/2020  Decreased Interest 0 0 0 0 0  Down, Depressed, Hopeless 0 0 0 0 0  PHQ - 2 Score 0 0 0 0 0  Altered sleeping 0 - - 0 0  Tired, decreased energy 0 - - 0 0  Change in appetite 0 - - 0 0  Feeling bad or failure about yourself  0 - - 0 0  Trouble concentrating 0 - - 0 0  Moving slowly or fidgety/restless 0 - - 0 0  Suicidal thoughts 0 - - 0 0  PHQ-9 Score 0 - - 0 0  Difficult doing work/chores - - - - -    phq 9 is {gen pos nLEX:[id number]}  Fall Risk: Fall Risk  09/10/2021 07/10/2021 04/08/2020 01/30/2020 01/18/2020  Falls in the past year? 0 0 0 0 0  Number falls in past yr: 0 0 0 0 0  Injury with Fall? 0 0 0 0 0  Risk for fall due to : No Fall Risks - No Fall Risks - -  Risk for fall due to: Comment - - - - -  Follow up Falls prevention discussed Falls evaluation completed Falls prevention discussed - -      Functional Status Survey:      Assessment & Plan  *** There are no diagnoses linked to this encounter.

## 2021-11-10 ENCOUNTER — Ambulatory Visit: Payer: Medicare HMO | Admitting: Family Medicine

## 2021-12-28 ENCOUNTER — Telehealth: Payer: Self-pay | Admitting: Family Medicine

## 2021-12-28 NOTE — Telephone Encounter (Signed)
Copied from [place] [id number]. Topic: Medicare AWV >> Dec 28, 2021 10:22 AM Cher Nakai R wrote: Reason for CRM:  Left message for patient to call back and schedule Medicare Annual Wellness Visit (AWV) in office.   If unable to come into the office for AWV,  please offer to do virtually or by telephone.  Last AWV: 04/08/2020  Please schedule at anytime with [place].  40 minute appointment  Any questions, please contact me at [phone]

## 2022-02-02 NOTE — Progress Notes (Deleted)
Name: Austin Ortega   MRN: [id number]    DOB: [birth year]   Date:02/02/2022 ? ?     Progress Note ? ?Subjective ? ?Chief Complaint ? ?Follow Up ? ?HPI ? ?Diabetes: he was seen in August 2022 after a lapse in visits and medications and A1C was very high at 14 %, he was feeling tired, thirsty and voiding more than usual. He agreed on starting Xultophy and is now taking Metformin daily, also Lisinopril hctz and atorvastatin. He was checking glucose with Free style but did not get the rx filled. He does not like checking FSBS. He gained some weight since last visit, congratulated him on his effort, but also discussed diet and needs to titrate dose of Xultophy as prescribed  ? ?HTN: continue medication, bp is at goal , denies chest pain or palpitation  ? ?Dyslipidemia: continue statin therapy  LDL at goal  ? ?CKI stage III with secondary hyperparathyroidism: we will recheck level today and if GFR below 50 we will refer him to nephrologist. urine micro negative, , denies pruritis  ? ?Patient Active Problem List  ? Diagnosis Date Noted  ? Secondary hyperparathyroidism ([place]) 07/13/2021  ? Chronic kidney disease, stage 3b ([place]) 07/13/2021  ? Non compliance with medical treatment 06/08/2018  ? Umbilical hernia without obstruction and without gangrene 04/21/2016  ? Vitiligo 04/21/2016  ? Decreased libido 03/04/2009  ? Pure hypercholesterolemia 10/01/2008  ? Benign essential HTN 08/27/2008  ? Type 2 diabetes mellitus with microalbuminuria ([place]) 08/27/2008  ? ? ?Past Surgical History:  ?Procedure Laterality Date  ? COLONOSCOPY WITH PROPOFOL N/A 03/13/2020  ? Procedure: COLONOSCOPY WITH PROPOFOL;  Surgeon: Jonathon Bellows, MD;  Location: [facility] ENDOSCOPY;  Service: Gastroenterology;  Laterality: N/A;  ? NO PAST SURGERIES    ? ? ?Family History  ?Problem Relation Age of Onset  ? Diabetes Mother   ? Hypertension Mother   ? Cancer Father   ?     Lung  ? Hypertension Sister   ? Diabetes Sister   ? Hypertension Brother   ? Hypertension Brother    ? Heart attack Brother   ? ? ?Social History  ? ?Tobacco Use  ? Smoking status: Former  ?  Packs/day: 0.50  ?  Years: 3.00  ?  Pack years: 1.50  ?  Types: Cigarettes  ?  Start date: 04/20/1978  ?  Quit date: 04/20/1981  ?  Years since quitting: 40.8  ? Smokeless tobacco: Never  ? Tobacco comments:  ?  more than 40 years ago  ?Substance Use Topics  ? Alcohol use: No  ?  Alcohol/week: 0.0 standard drinks  ? ? ? ?Current Outpatient Medications:  ?  aspirin 81 MG EC tablet, Take by mouth., Disp: , Rfl:  ?  atorvastatin (LIPITOR) 40 MG tablet, Take 1 tablet (40 mg total) by mouth daily., Disp: 90 tablet, Rfl: 0 ?  Cod Liver Oil 1000 MG CAPS, Take by mouth., Disp: , Rfl:  ?  Continuous Blood Gluc Sensor (FREESTYLE LIBRE 2 SENSOR) MISC, 1 each by Does not apply route daily., Disp: 2 each, Rfl: 5 ?  Insulin Degludec-Liraglutide (XULTOPHY) 100-3.6 UNIT-MG/ML SOPN, Inject 16-50 Units into the skin daily with breakfast., Disp: 15 mL, Rfl: 0 ?  Insulin Pen Needle 32G X 6 MM MISC, 1 each by Does not apply route daily., Disp: 100 each, Rfl: 1 ?  lisinopril-hydrochlorothiazide (ZESTORETIC) 20-25 MG tablet, Take 1 tablet by mouth daily., Disp: 90 tablet, Rfl: 0 ?  metFORMIN (GLUCOPHAGE-XR) 750 MG 24  hr tablet, TAKE 2 TABLETS BY MOUTH ONCE DAILY WITH BREAKFAST, Disp: 180 tablet, Rfl: 0 ? ?Allergies  ?Allergen Reactions  ? Shrimp [Shellfish Allergy] Swelling  ? ? ?I personally reviewed active problem list, medication list, allergies, family history, social history, health maintenance with the patient/caregiver today. ? ? ?ROS ? ?*** ? ?Objective ? ?There were no vitals filed for this visit. ? ?There is no height or weight on file to calculate BMI. ? ?Physical Exam ?*** ? ?No results found for this or any previous visit (from the past 2160 hour(s)). ? ?Diabetic Foot Exam: ?Diabetic Foot Exam - Simple   ?No data filed ?  ? ?*** ? ?PHQ2/9: ?Depression screen [facility] 2/9 09/10/2021 07/10/2021 04/08/2020 01/30/2020 01/18/2020  ?Decreased Interest  0 0 0 0 0  ?Down, Depressed, Hopeless 0 0 0 0 0  ?PHQ - 2 Score 0 0 0 0 0  ?Altered sleeping 0 - - 0 0  ?Tired, decreased energy 0 - - 0 0  ?Change in appetite 0 - - 0 0  ?Feeling bad or failure about yourself  0 - - 0 0  ?Trouble concentrating 0 - - 0 0  ?Moving slowly or fidgety/restless 0 - - 0 0  ?Suicidal thoughts 0 - - 0 0  ?PHQ-9 Score 0 - - 0 0  ?Difficult doing work/chores - - - - -  ?  ?phq 9 is {gen pos neg:315643} ? ? ?Fall Risk: ?Fall Risk  09/10/2021 07/10/2021 04/08/2020 01/30/2020 01/18/2020  ?Falls in the past year? 0 0 0 0 0  ?Number falls in past yr: 0 0 0 0 0  ?Injury with Fall? 0 0 0 0 0  ?Risk for fall due to : No Fall Risks - No Fall Risks - -  ?Risk for fall due to: Comment - - - - -  ?Follow up Falls prevention discussed Falls evaluation completed Falls prevention discussed - -  ? ? ? ? ?Functional Status Survey: ?  ? ? ? ?Assessment & Plan ? ?*** ?There are no diagnoses linked to this encounter.  ?

## 2022-02-03 ENCOUNTER — Ambulatory Visit: Payer: Medicare HMO | Admitting: Family Medicine

## 2022-02-12 ENCOUNTER — Telehealth: Payer: Self-pay | Admitting: Family Medicine

## 2022-02-12 NOTE — Telephone Encounter (Signed)
Copied from [place] [id number]. Topic: Medicare AWV ?>> Feb 12, 2022 12:16 PM Cher Nakai R wrote: ?Reason for CRM:  ?No answer unable to leave a message for patient to call back and schedule Medicare Annual Wellness Visit (AWV) in office.  ? ?If unable to come into the office for AWV,  please offer to do virtually or by telephone. ? ?Last AWV:  04/08/2020 ? ?Please schedule at anytime with [facility]. ? ?30 minute appointment for Virtual or phone ?45 minute appointment for in office or Initial virtual/phone ? ?Any questions, please contact me at [phone] ?

## 2022-04-09 ENCOUNTER — Ambulatory Visit: Payer: Medicare HMO | Admitting: Family Medicine

## 2022-04-09 NOTE — Progress Notes (Deleted)
Name: Austin Ortega   MRN: [id number]    DOB: 09-01-1951   Date:04/09/2022       Progress Note  Subjective  Chief Complaint  Follow up   HPI  Diabetes: he was seen in August 2022 after a lapse in visits and medications and A1C was very high at 14 %, he was feeling tired, thirsty and voiding more than usual. He agreed on starting Xultophy and is now taking Metformin daily, also Lisinopril hctz and atorvastatin. He was checking glucose with Free style but did not get the rx filled. He does not like checking FSBS. He gained some weight since last visit, congratulated him on his effort, but also discussed diet and needs to titrate dose of Xultophy as prescribed   HTN: continue medication, bp is at goal , denies chest pain or palpitation   Dyslipidemia: continue statin therapy  LDL at goal   CKI stage III with secondary hyperparathyroidism: we will recheck level today and if GFR below 50 we will refer him to nephrologist. urine micro negative, , denies pruritis   Patient Active Problem List   Diagnosis Date Noted   Secondary hyperparathyroidism ([place]) 07/13/2021   Chronic kidney disease, stage 3b ([place]) 07/13/2021   Non compliance with medical treatment 82/70/7867   Umbilical hernia without obstruction and without gangrene 04/21/2016   Vitiligo 04/21/2016   Decreased libido 03/04/2009   Pure hypercholesterolemia 10/01/2008   Benign essential HTN 08/27/2008   Type 2 diabetes mellitus with microalbuminuria ([place]) 08/27/2008    Past Surgical History:  Procedure Laterality Date   COLONOSCOPY WITH PROPOFOL N/A 03/13/2020   Procedure: COLONOSCOPY WITH PROPOFOL;  Surgeon: Jonathon Bellows, MD;  Location[facility] ENDOSCOPY;  Service: Gastroenterology;  Laterality: N/A;   NO PAST SURGERIES      Family History  Problem Relation Age of Onset   Diabetes Mother    Hypertension Mother    Cancer Father        Lung   Hypertension Sister    Diabetes Sister    Hypertension Brother    Hypertension Brother     Heart attack Brother     Social History   Tobacco Use   Smoking status: Former    Packs/day: 0.50    Years: 3.00    Pack years: 1.50    Types: Cigarettes    Start date: 04/20/1978    Quit date: 04/20/1981    Years since quitting: 40.9   Smokeless tobacco: Never   Tobacco comments:    more than 40 years ago  Substance Use Topics   Alcohol use: No    Alcohol/week: 0.0 standard drinks     Current Outpatient Medications:    aspirin 81 MG EC tablet, Take by mouth., Disp: , Rfl:    atorvastatin (LIPITOR) 40 MG tablet, Take 1 tablet (40 mg total) by mouth daily., Disp: 90 tablet, Rfl: 0   Cod Liver Oil 1000 MG CAPS, Take by mouth., Disp: , Rfl:    Continuous Blood Gluc Sensor (FREESTYLE LIBRE 2 SENSOR) MISC, 1 each by Does not apply route daily., Disp: 2 each, Rfl: 5   Insulin Degludec-Liraglutide (XULTOPHY) 100-3.6 UNIT-MG/ML SOPN, Inject 16-50 Units into the skin daily with breakfast., Disp: 15 mL, Rfl: 0   Insulin Pen Needle 32G X 6 MM MISC, 1 each by Does not apply route daily., Disp: 100 each, Rfl: 1   lisinopril-hydrochlorothiazide (ZESTORETIC) 20-25 MG tablet, Take 1 tablet by mouth daily., Disp: 90 tablet, Rfl: 0   metFORMIN (GLUCOPHAGE-XR) 750 MG  24 hr tablet, TAKE 2 TABLETS BY MOUTH ONCE DAILY WITH BREAKFAST, Disp: 180 tablet, Rfl: 0  Allergies  Allergen Reactions   Shrimp [Shellfish Allergy] Swelling    I personally reviewed {Reviewed:14835} with the patient/caregiver today.   ROS  ***  Objective  There were no vitals filed for this visit.  There is no height or weight on file to calculate BMI.  Physical Exam ***  No results found for this or any previous visit (from the past 2160 hour(s)).  Diabetic Foot Exam: Diabetic Foot Exam - Simple   No data filed    ***  PHQ2/9:    09/10/2021    9:50 AM 07/10/2021   11:29 AM 04/08/2020    3:47 PM 01/30/2020    3:54 PM 01/18/2020   10:21 AM  Depression screen PHQ 2/9  Decreased Interest 0 0 0 0 0  Down,  Depressed, Hopeless 0 0 0 0 0  PHQ - 2 Score 0 0 0 0 0  Altered sleeping 0   0 0  Tired, decreased energy 0   0 0  Change in appetite 0   0 0  Feeling bad or failure about yourself  0   0 0  Trouble concentrating 0   0 0  Moving slowly or fidgety/restless 0   0 0  Suicidal thoughts 0   0 0  PHQ-9 Score 0   0 0    phq 9 is {gen pos LAG:536468} ***  Fall Risk:    09/10/2021    9:49 AM 07/10/2021   11:29 AM 04/08/2020    3:50 PM 01/30/2020    3:54 PM 01/18/2020   10:21 AM  Fall Risk   Falls in the past year? 0 0 0 0 0  Number falls in past yr: 0 0 0 0 0  Injury with Fall? 0 0 0 0 0  Risk for fall due to : No Fall Risks  No Fall Risks    Follow up Falls prevention discussed Falls evaluation completed Falls prevention discussed     ***   Functional Status Survey:   ***   Assessment & Plan  *** There are no diagnoses linked to this encounter.

## 2024-02-13 ENCOUNTER — Emergency Department

## 2024-02-13 DIAGNOSIS — R519 Headache, unspecified: Secondary | ICD-10-CM | POA: Diagnosis not present

## 2024-02-13 DIAGNOSIS — S0592XA Unspecified injury of left eye and orbit, initial encounter: Secondary | ICD-10-CM | POA: Insufficient documentation

## 2024-02-13 DIAGNOSIS — I129 Hypertensive chronic kidney disease with stage 1 through stage 4 chronic kidney disease, or unspecified chronic kidney disease: Secondary | ICD-10-CM | POA: Diagnosis not present

## 2024-02-13 DIAGNOSIS — E119 Type 2 diabetes mellitus without complications: Secondary | ICD-10-CM | POA: Insufficient documentation

## 2024-02-13 DIAGNOSIS — Z7984 Long term (current) use of oral hypoglycemic drugs: Secondary | ICD-10-CM | POA: Diagnosis not present

## 2024-02-13 DIAGNOSIS — E1129 Type 2 diabetes mellitus with other diabetic kidney complication: Secondary | ICD-10-CM | POA: Diagnosis not present

## 2024-02-13 DIAGNOSIS — Z7982 Long term (current) use of aspirin: Secondary | ICD-10-CM | POA: Insufficient documentation

## 2024-02-13 DIAGNOSIS — Z794 Long term (current) use of insulin: Secondary | ICD-10-CM | POA: Insufficient documentation

## 2024-02-13 DIAGNOSIS — Z79899 Other long term (current) drug therapy: Secondary | ICD-10-CM | POA: Insufficient documentation

## 2024-02-13 DIAGNOSIS — N1831 Chronic kidney disease, stage 3a: Secondary | ICD-10-CM | POA: Diagnosis not present
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
## 2024-02-13 NOTE — ED Provider Triage Note (Signed)
 Emergency Medicine Provider Triage Evaluation Note  Austin Ortega , a 73 y.o. male  was evaluated in triage.  Pt complains of left eye trauma last week.  Patient denies blurry vision, patient states headache left frontal area.  Review of Systems  Positive:  Negative:   Physical Exam  BP (!) 186/106 (BP Location: Left Arm)   Pulse 97   Temp 98 F (36.7 C) (Oral)   Resp 18   SpO2 98%  Gen:   Awake, no distress   Resp:  Normal effort  MSK:   Moves extremities without difficulty  Other:  Left eye: Nontender to palpation, corneal erythema  Medical Decision Making  Medically screening exam initiated at 6:51 PM.  Appropriate orders placed.  Austin Ortega was informed that the remainder of the evaluation will be completed by another provider, this initial triage assessment does not replace that evaluation, and the importance of remaining in the ED until their evaluation is complete.  Patient with left eye trauma   Austin Damme, PA-C 02/13/24 9528

## 2024-02-14 ENCOUNTER — Emergency Department
Admission: EM | Admit: 2024-02-14 | Discharge: 2024-02-14 | Disposition: A | Discharge status: Home / Self Care | Attending: Emergency Medicine | Admitting: Emergency Medicine

## 2024-02-14 DIAGNOSIS — S0592XA Unspecified injury of left eye and orbit, initial encounter: Secondary | ICD-10-CM

## 2024-02-14 MED ORDER — TOBRAMYCIN 0.3 % OP SOLN: 2.0000 [drp] | OPHTHALMIC | 0 refills | Status: AC

## 2024-02-14 MED ORDER — FLUORESCEIN SODIUM 1 MG OP STRP
1.0000 | ORAL_STRIP | Freq: Once | OPHTHALMIC | Status: AC
  Administered 2024-02-14: 1 via OPHTHALMIC
  Filled 2024-02-14: qty 1

## 2024-02-14 MED ORDER — HYDROCODONE-ACETAMINOPHEN 5-325 MG PO TABS: 1.0000 | ORAL_TABLET | Freq: Four times a day (QID) | ORAL | 0 refills | Status: DC | PRN

## 2024-02-14 MED ORDER — TETRACAINE HCL 0.5 % OP SOLN
2.0000 [drp] | Freq: Once | OPHTHALMIC | Status: AC
  Administered 2024-02-14: 2 [drp] via OPHTHALMIC
  Filled 2024-02-14: qty 4

## 2024-02-14 MED ORDER — TOBRAMYCIN 0.3 % OP SOLN
2.0000 [drp] | OPHTHALMIC | Status: DC
  Administered 2024-02-14: 2 [drp] via OPHTHALMIC
  Filled 2024-02-14: qty 5
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
## 2024-02-14 NOTE — Discharge Instructions (Addendum)
 Apply antibiotic eyedrops 2 drops to left eye every 4 hours while awake x 7 days.  You may take Tylenol and/or Ibuprofen as needed for pain, Norco as needed for more severe pain.  Return to the ER for worsening symptoms, vision changes, persistent vomiting or other concerns.

## 2024-07-30 ENCOUNTER — Encounter: Payer: Self-pay | Admitting: Family Medicine

## 2024-07-30 ENCOUNTER — Ambulatory Visit (INDEPENDENT_AMBULATORY_CARE_PROVIDER_SITE_OTHER): Admitting: Family Medicine

## 2024-07-30 VITALS — BP 134/82 | HR 57 | Resp 16 | Ht 73.0 in | Wt 200.2 lb

## 2024-07-30 DIAGNOSIS — E1165 Type 2 diabetes mellitus with hyperglycemia: Secondary | ICD-10-CM

## 2024-07-30 DIAGNOSIS — N1832 Chronic kidney disease, stage 3b: Secondary | ICD-10-CM

## 2024-07-30 DIAGNOSIS — E1129 Type 2 diabetes mellitus with other diabetic kidney complication: Secondary | ICD-10-CM

## 2024-07-30 DIAGNOSIS — R809 Proteinuria, unspecified: Secondary | ICD-10-CM | POA: Diagnosis not present

## 2024-07-30 DIAGNOSIS — R202 Paresthesia of skin: Secondary | ICD-10-CM

## 2024-07-30 DIAGNOSIS — E7800 Pure hypercholesterolemia, unspecified: Secondary | ICD-10-CM

## 2024-07-30 DIAGNOSIS — N2581 Secondary hyperparathyroidism of renal origin: Secondary | ICD-10-CM

## 2024-07-30 DIAGNOSIS — I10 Essential (primary) hypertension: Secondary | ICD-10-CM

## 2024-07-30 DIAGNOSIS — E785 Hyperlipidemia, unspecified: Secondary | ICD-10-CM

## 2024-07-30 DIAGNOSIS — R634 Abnormal weight loss: Secondary | ICD-10-CM

## 2024-07-30 DIAGNOSIS — E1169 Type 2 diabetes mellitus with other specified complication: Secondary | ICD-10-CM

## 2024-07-30 DIAGNOSIS — Z125 Encounter for screening for malignant neoplasm of prostate: Secondary | ICD-10-CM

## 2024-07-30 DIAGNOSIS — Z860109 Personal history of other colon polyps: Secondary | ICD-10-CM

## 2024-07-30 LAB — POCT GLYCOSYLATED HEMOGLOBIN (HGB A1C): Hemoglobin A1C: 14 % — AB (ref 4.0–5.6)

## 2024-07-30 MED ORDER — SOLIQUA 100-33 UNT-MCG/ML ~~LOC~~ SOPN: 15.0000 [IU] | PEN_INJECTOR | Freq: Every day | SUBCUTANEOUS | 0 refills | Status: DC

## 2024-07-30 MED ORDER — FREESTYLE LIBRE 2 SENSOR MISC
1.0000 | Freq: Every day | 5 refills | Status: AC
Start: 2024-07-30 — End: ?

## 2024-07-30 MED ORDER — ATORVASTATIN CALCIUM 40 MG PO TABS: 40.0000 mg | ORAL_TABLET | Freq: Every day | ORAL | 0 refills | Status: DC

## 2024-07-30 NOTE — Progress Notes (Signed)
 Name: Austin Ortega   MRN: [id number]    DOB: February 08, 1951   Date:07/30/2024       Progress Note  Subjective  Chief Complaint  Chief Complaint  Patient presents with   Weight Loss   Numbness    Bilateral feet, about a year   Discussed the use of AI scribe software for clinical note transcription with the patient, who gave verbal consent to proceed.  History of Present Illness Austin Ortega is a 73 year old male with uncontrolled diabetes who presents with unintentional weight loss.  He has experienced significant unintentional weight loss, losing approximately 30 pounds since his last visit in October 2022 when he weighed 232 pounds. His current weight is 200 pounds. The weight loss has been gradual and unintentional, which he attributes to a decreased appetite and not cooking as much as he used to. He often eats out and shares meals with his wife but does not feel hungry often.  He has a history of uncontrolled diabetes, with glucose levels remaining high and an A1c over 14. He previously used insulin /Xultophy  but ran out of medication months ago.  He experiences frequent urination and prefers sweet tea.  He denies polyphagia   He experiences numbness in his feet, ongoing for about a year, without tingling or pain. He has not experienced any falls but notes that the numbness affects his balance.  He has a history of chronic kidney disease stage 3B and secondary hyperparathyroidism. He has a history of HTN but bp is okay today and he has been off medication - lisinopril  hydrochlorothiazide  for months   No abdominal pain, changes in bowel movements, or nausea. He denies any recent falls or injuries and maintains a regular bowel pattern of once daily.  He has experienced significant family stress, with the loss of two brothers and a sister in the past two years, impacting his ability to focus on his own health.    Patient Active Problem List   Diagnosis Date Noted   Secondary  hyperparathyroidism (HCC) 07/13/2021   Chronic kidney disease, stage 3b (HCC) 07/13/2021   Non compliance with medical treatment 06/08/2018   Umbilical hernia without obstruction and without gangrene 04/21/2016   Vitiligo 04/21/2016   Decreased libido 03/04/2009   Pure hypercholesterolemia 10/01/2008   Benign essential HTN 08/27/2008   Type 2 diabetes mellitus with microalbuminuria (HCC) 08/27/2008    Past Surgical History:  Procedure Laterality Date   COLONOSCOPY WITH PROPOFOL  N/A 03/13/2020   Procedure: COLONOSCOPY WITH PROPOFOL ;  Surgeon: Therisa Bi, MD;  Location: [facility] ENDOSCOPY;  Service: Gastroenterology;  Laterality: N/A;   NO PAST SURGERIES      Family History  Problem Relation Age of Onset   Diabetes Mother    Hypertension Mother    Cancer Father        Lung   Hypertension Sister    Diabetes Sister    Hypertension Brother    Hypertension Brother    Heart attack Brother     Social History   Tobacco Use   Smoking status: Former    Current packs/day: 0.00    Average packs/day: 0.5 packs/day for 3.0 years (1.5 ttl pk-yrs)    Types: Cigarettes    Start date: 04/20/1978    Quit date: 04/20/1981    Years since quitting: 43.3   Smokeless tobacco: Never   Tobacco comments:    more than 40 years ago  Substance Use Topics   Alcohol use: No    Alcohol/week: 0.0 standard drinks  of alcohol     Current Outpatient Medications:    aspirin 81 MG EC tablet, Take by mouth. (Patient not taking: Reported on 07/30/2024), Disp: , Rfl:    atorvastatin  (LIPITOR) 40 MG tablet, Take 1 tablet (40 mg total) by mouth daily. (Patient not taking: Reported on 07/30/2024), Disp: 90 tablet, Rfl: 0   Cod Liver Oil 1000 MG CAPS, Take by mouth. (Patient not taking: Reported on 07/30/2024), Disp: , Rfl:    Continuous Blood Gluc Sensor (FREESTYLE LIBRE 2 SENSOR) MISC, 1 each by Does not apply route daily. (Patient not taking: Reported on 07/30/2024), Disp: 2 each, Rfl: 5   HYDROcodone -acetaminophen   (NORCO/VICODIN) 5-325 MG tablet, Take 1 tablet by mouth every 6 (six) hours as needed for moderate pain (pain score 4-6). (Patient not taking: Reported on 07/30/2024), Disp: 8 tablet, Rfl: 0   Insulin  Degludec-Liraglutide (XULTOPHY ) 100-3.6 UNIT-MG/ML SOPN, Inject 16-50 Units into the skin daily with breakfast. (Patient not taking: Reported on 07/30/2024), Disp: 15 mL, Rfl: 0   Insulin  Pen Needle 32G X 6 MM MISC, 1 each by Does not apply route daily. (Patient not taking: Reported on 07/30/2024), Disp: 100 each, Rfl: 1   lisinopril -hydrochlorothiazide  (ZESTORETIC ) 20-25 MG tablet, Take 1 tablet by mouth daily. (Patient not taking: Reported on 07/30/2024), Disp: 90 tablet, Rfl: 0   metFORMIN  (GLUCOPHAGE -XR) 750 MG 24 hr tablet, TAKE 2 TABLETS BY MOUTH ONCE DAILY WITH BREAKFAST (Patient not taking: Reported on 07/30/2024), Disp: 180 tablet, Rfl: 0  Allergies  Allergen Reactions   Shrimp [Shellfish Allergy] Swelling    I personally reviewed active problem list, medication list, allergies, family history with the patient/caregiver today.   ROS  Ten systems reviewed and is negative except as mentioned in HPI    Objective Physical Exam  CONSTITUTIONAL: Patient appears well-developed and well-nourished. No distress. HEENT: Head atraumatic, normocephalic, neck supple. CARDIOVASCULAR: Normal rate, regular rhythm and normal heart sounds. No murmur heard. No BLE edema. PULMONARY: Effort normal and breath sounds normal. No respiratory distress. ABDOMINAL: There is no tenderness or distention. MUSCULOSKELETAL: Normal gait. Without gross motor deficit. PSYCHIATRIC: Patient has a normal mood and affect. Behavior is normal. Judgment and thought content normal. NEUROLOGICAL: Sensation intact but diminished in feet.  Vitals:   07/30/24 1411  BP: 134/82  Pulse: (!) 57  Resp: 16  SpO2: 97%  Weight: 200 lb 3.2 oz (90.8 kg)  Height: 6' 1 (1.854 m)    Body mass index is 26.41 kg/m.  Recent Results (from  the past 2160 hours)  POCT glycosylated hemoglobin (Hb A1C)     Status: Abnormal   Collection Time: 07/30/24  2:16 PM  Result Value Ref Range   Hemoglobin A1C 14.0 (A) 4.0 - 5.6 %    Comment: >14   HbA1c POC (<> result, manual entry)     HbA1c, POC (prediabetic range)     HbA1c, POC (controlled diabetic range)      Diabetic Foot Exam:  Diabetic foot exam was performed with the following findings:   No deformities, ulcerations, or other skin breakdown Normal sensation of 10g monofilament Intact posterior tibialis and dorsalis pedis pulses      PHQ2/9:    07/30/2024    1:57 PM 09/10/2021    9:50 AM 07/10/2021   11:29 AM 04/08/2020    3:47 PM 01/30/2020    3:54 PM  Depression screen PHQ 2/9  Decreased Interest 0 0 0 0 0  Down, Depressed, Hopeless 0 0 0 0 0  PHQ - 2  Score 0 0 0 0 0  Altered sleeping  0   0  Tired, decreased energy  0   0  Change in appetite  0   0  Feeling bad or failure about yourself   0   0  Trouble concentrating  0   0  Moving slowly or fidgety/restless  0   0  Suicidal thoughts  0   0  PHQ-9 Score  0   0    phq 9 is negative  Fall Risk:    07/30/2024    1:57 PM 09/10/2021    9:49 AM 07/10/2021   11:29 AM 04/08/2020    3:50 PM 01/30/2020    3:54 PM  Fall Risk   Falls in the past year? 0 0 0 0 0   Number falls in past yr: 0 0 0 0 0  Injury with Fall? 0 0 0 0 0  Risk for fall due to : No Fall Risks No Fall Risks  No Fall Risks   Follow up Falls evaluation completed Falls prevention discussed  Falls evaluation completed  Falls prevention discussed       Data saved with a previous flowsheet row definition      Assessment & Plan Type 2 diabetes mellitus with poor glycemic control and diabetic neuropathy Diabetes uncontrolled with A1c over 14. Experiencing diabetic neuropathy. Weight loss likely due to uncontrolled diabetes. Risks include cardiovascular events and further neuropathy. - Initiate insulin  therapy at 16 units, increase by 2 units every 3  days until fasting glucose is 100-140 mg/dL. - Prescribe Freestyle Libre 3 for continuous glucose monitoring. - Advise strict dietary modifications, avoid sweetened beverages and high-carbohydrate foods. - Check B12 levels to rule out deficiency contributing to neuropathy.  Chronic kidney disease stage 3b with secondary hyperparathyroidism Current kidney function unknown. Metformin  and lisinopril  held until assessment to avoid harm. - Order kidney function tests. - Check vitamin D  levels. - Hold metformin  and lisinopril  until kidney function is assessed.  Abnormal weight loss Significant weight loss of 30 pounds. Differential includes uncontrolled diabetes and potential malignancy. - Order PSA test to rule out prostate cancer. - Refer to GI for colonoscopy due to history of polyps and weight loss. - Check thyroid function. - Order HIV test to rule out infection as a cause of weight loss.  Hyperlipidemia Requires management due to diabetes and cardiovascular risk. - Prescribe atorvastatin . - Order lipid panel.  Essential hypertension Blood pressure 134/82 mmHg, likely improved due to weight loss.  Personal history of colon polyps Due for repeat colonoscopy due to previous findings of multiple polyps. - Refer to GI for colonoscopy.  Follow-Up Follow-up needed to assess response to treatment and review test results. - Schedule follow-up appointment in one month. - Perform fructosamine test at follow-up to assess short-term glucose control.

## 2024-07-31 ENCOUNTER — Other Ambulatory Visit (HOSPITAL_COMMUNITY): Payer: Self-pay

## 2024-07-31 LAB — CBC WITH DIFFERENTIAL/PLATELET
Absolute Lymphocytes: 1915 {cells}/uL (ref 850–3900)
Absolute Monocytes: 410 {cells}/uL (ref 200–950)
Basophils Absolute: 38 {cells}/uL (ref 0–200)
Basophils Relative: 0.5 %
Eosinophils Absolute: 91 {cells}/uL (ref 15–500)
Eosinophils Relative: 1.2 %
HCT: 45.6 % (ref 38.5–50.0)
Hemoglobin: 14.5 g/dL (ref 13.2–17.1)
MCH: 28.4 pg (ref 27.0–33.0)
MCHC: 31.8 g/dL — ABNORMAL LOW (ref 32.0–36.0)
MCV: 89.2 fL (ref 80.0–100.0)
MPV: 10.6 fL (ref 7.5–12.5)
Monocytes Relative: 5.4 %
Neutro Abs: 5145 {cells}/uL (ref 1500–7800)
Neutrophils Relative %: 67.7 %
Platelets: 248 Thousand/uL (ref 140–400)
RBC: 5.11 Million/uL (ref 4.20–5.80)
RDW: 11.8 % (ref 11.0–15.0)
Total Lymphocyte: 25.2 %
WBC: 7.6 Thousand/uL (ref 3.8–10.8)

## 2024-07-31 LAB — LIPID PANEL
Cholesterol: 257 mg/dL — ABNORMAL HIGH (ref <200)
HDL: 66 mg/dL (ref >=40)
LDL Cholesterol (Calc): 164 mg/dL — ABNORMAL HIGH
Non-HDL Cholesterol (Calc): 191 mg/dL — ABNORMAL HIGH (ref <130)
Total CHOL/HDL Ratio: 3.9 (calc) (ref <5.0)
Triglycerides: 133 mg/dL (ref <150)

## 2024-07-31 LAB — COMPREHENSIVE METABOLIC PANEL WITH GFR
AG Ratio: 1.2 (calc) (ref 1.0–2.5)
ALT: 12 U/L (ref 9–46)
AST: 11 U/L (ref 10–35)
Albumin: 3.8 g/dL (ref 3.6–5.1)
Alkaline phosphatase (APISO): 105 U/L (ref 35–144)
BUN/Creatinine Ratio: 16 (calc) (ref 6–22)
BUN: 23 mg/dL (ref 7–25)
CO2: 26 mmol/L (ref 20–32)
Calcium: 9.5 mg/dL (ref 8.6–10.3)
Chloride: 100 mmol/L (ref 98–110)
Creat: 1.42 mg/dL — ABNORMAL HIGH (ref 0.70–1.28)
Globulin: 3.1 g/dL (ref 1.9–3.7)
Glucose, Bld: 399 mg/dL — ABNORMAL HIGH (ref 65–99)
Potassium: 4.7 mmol/L (ref 3.5–5.3)
Sodium: 135 mmol/L (ref 135–146)
Total Bilirubin: 0.7 mg/dL (ref 0.2–1.2)
Total Protein: 6.9 g/dL (ref 6.1–8.1)
eGFR: 52 mL/min/1.73m2 — ABNORMAL LOW (ref >=60)

## 2024-07-31 LAB — VITAMIN D 25 HYDROXY (VIT D DEFICIENCY, FRACTURES): Vit D, 25-Hydroxy: 16 ng/mL — ABNORMAL LOW (ref 30–100)

## 2024-07-31 LAB — HIV ANTIBODY (ROUTINE TESTING W REFLEX)
HIV 1&2 Ab, 4th Generation: NONREACTIVE
HIV FINAL INTERPRETATION: NEGATIVE

## 2024-07-31 LAB — SEDIMENTATION RATE: Sed Rate: 33 mm/h — ABNORMAL HIGH (ref 0–20)

## 2024-07-31 LAB — PARATHYROID HORMONE, INTACT (NO CA): PTH: 76 pg/mL (ref 16–77)

## 2024-07-31 LAB — MICROALBUMIN / CREATININE URINE RATIO
Creatinine, Urine: 132 mg/dL (ref 20–320)
Microalb Creat Ratio: 875 mg/g{creat} — ABNORMAL HIGH (ref <30)
Microalb, Ur: 115.5 mg/dL

## 2024-07-31 LAB — PSA: PSA: 4.76 ng/mL — ABNORMAL HIGH (ref <=4.00)

## 2024-07-31 LAB — C-REACTIVE PROTEIN: CRP: 3.3 mg/L (ref <8.0)

## 2024-07-31 LAB — TSH: TSH: 1.08 m[IU]/L (ref 0.40–4.50)

## 2024-08-01 ENCOUNTER — Ambulatory Visit: Payer: Self-pay | Admitting: Family Medicine

## 2024-08-02 ENCOUNTER — Other Ambulatory Visit: Payer: Self-pay

## 2024-08-02 DIAGNOSIS — R634 Abnormal weight loss: Secondary | ICD-10-CM

## 2024-08-27 ENCOUNTER — Ambulatory Visit (INDEPENDENT_AMBULATORY_CARE_PROVIDER_SITE_OTHER): Admitting: Family Medicine

## 2024-08-27 ENCOUNTER — Encounter: Payer: Self-pay | Admitting: Family Medicine

## 2024-08-27 VITALS — BP 130/82 | HR 97 | Resp 16 | Ht 73.0 in | Wt 205.4 lb

## 2024-08-27 DIAGNOSIS — Z23 Encounter for immunization: Secondary | ICD-10-CM | POA: Diagnosis not present

## 2024-08-27 DIAGNOSIS — Z794 Long term (current) use of insulin: Secondary | ICD-10-CM | POA: Diagnosis not present

## 2024-08-27 DIAGNOSIS — N1831 Chronic kidney disease, stage 3a: Secondary | ICD-10-CM | POA: Diagnosis not present

## 2024-08-27 DIAGNOSIS — E1122 Type 2 diabetes mellitus with diabetic chronic kidney disease: Secondary | ICD-10-CM | POA: Diagnosis not present

## 2024-08-27 DIAGNOSIS — E1169 Type 2 diabetes mellitus with other specified complication: Secondary | ICD-10-CM | POA: Diagnosis not present

## 2024-08-27 DIAGNOSIS — E785 Hyperlipidemia, unspecified: Secondary | ICD-10-CM

## 2024-08-27 MED ORDER — SOLIQUA 100-33 UNT-MCG/ML ~~LOC~~ SOPN: 15.0000 [IU] | PEN_INJECTOR | Freq: Every day | SUBCUTANEOUS | 0 refills | Status: AC

## 2024-08-27 MED ORDER — EMPAGLIFLOZIN 25 MG PO TABS
25.0000 mg | ORAL_TABLET | Freq: Every day | ORAL | 3 refills | Status: AC
Start: 2024-08-27 — End: ?

## 2024-08-27 MED ORDER — ATORVASTATIN CALCIUM 40 MG PO TABS: 40.0000 mg | ORAL_TABLET | Freq: Every day | ORAL | 0 refills | Status: AC

## 2024-08-27 MED ORDER — FLUCONAZOLE 150 MG PO TABS: 150.0000 mg | ORAL_TABLET | ORAL | 2 refills | Status: AC

## 2024-08-27 NOTE — Progress Notes (Signed)
 Name: Austin Ortega   MRN: [id number]    DOB: 1951-08-14   Date:08/27/2024       Progress Note  Subjective  Chief Complaint  Chief Complaint  Patient presents with   Medical Management of Chronic Issues   Discussed the use of AI scribe software for clinical note transcription with the patient, who gave verbal consent to proceed.  History of Present Illness Austin Ortega is a 73 year old male with type 2 diabetes and chronic kidney disease who presents for a follow-up visit.  He has been experiencing high blood sugar levels, with recent readings around 200 mg/dL, which is an improvement from previous levels of 400 mg/dL. He was using a glucose sensor, but it stopped working after 12 days and has not been replaced. He was initially started on 16 units of Soliqua  and has increased to 20 units. He is also taking atorvastatin  once daily for cholesterol management.  He has a history of significant weight loss, dropping from 232 pounds in October 2022 to 200 pounds.  His A1c was previously 14%, indicating poor glycemic control. His glucose level was 399 mg/dL during a previous visit in September, and his kidney function has improved from a GFR of 31 to 50's though he remains in stage 3a chronic kidney disease.  He reports a recent significant life event, the death of his son from brain cancer, which has impacted his diet and emotional state. He has been consuming more 'junk' food due to the influx of casseroles and other foods brought by visitors. 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  He lives with his son Austin Ortega and a granddaughter. There is no reported family history of high blood pressure or diabetes among his children.    Patient Active Problem List   Diagnosis Date Noted   Secondary hyperparathyroidism 07/13/2021   Chronic kidney disease, stage 3b (HCC) 07/13/2021   Non compliance with medical treatment 06/08/2018    Umbilical hernia without obstruction and without gangrene 04/21/2016   Vitiligo 04/21/2016   Decreased libido 03/04/2009   Pure hypercholesterolemia 10/01/2008   Benign essential HTN 08/27/2008   Type 2 diabetes mellitus with microalbuminuria (HCC) 08/27/2008    Past Surgical History:  Procedure Laterality Date   COLONOSCOPY WITH PROPOFOL  N/A 03/13/2020   Procedure: COLONOSCOPY WITH PROPOFOL ;  Surgeon: Therisa Bi, MD;  Location: [facility] ENDOSCOPY;  Service: Gastroenterology;  Laterality: N/A;   NO PAST SURGERIES      Family History  Problem Relation Age of Onset   Diabetes Mother    Hypertension Mother    Cancer Father        Lung   Hypertension Sister    Diabetes Sister    Stroke Sister    Heart failure Brother    Hypertension Brother    Hypertension Brother    Heart block Brother    Brain cancer Son     Social History   Tobacco Use   Smoking status: Former    Current packs/day: 0.00    Average packs/day: 0.5 packs/day for 3.0 years (1.5 ttl pk-yrs)    Types: Cigarettes    Start date: 04/20/1978    Quit date: 04/20/1981    Years since quitting: 43.3   Smokeless tobacco: Never   Tobacco comments:    more than 40 years ago  Substance Use Topics   Alcohol use: No    Alcohol/week: 0.0 standard drinks of alcohol     Current  Outpatient Medications:    aspirin 81 MG EC tablet, Take by mouth., Disp: , Rfl:    atorvastatin  (LIPITOR) 40 MG tablet, Take 1 tablet (40 mg total) by mouth daily., Disp: 90 tablet, Rfl: 0   Continuous Glucose Sensor (FREESTYLE LIBRE 2 SENSOR) MISC, 1 each by Does not apply route daily., Disp: 2 each, Rfl: 5   Insulin  Glargine-Lixisenatide (SOLIQUA ) 100-33 UNT-MCG/ML SOPN, Inject 15-50 Units into the skin daily before breakfast., Disp: 18 mL, Rfl: 0   Insulin  Pen Needle 32G X 6 MM MISC, 1 each by Does not apply route daily., Disp: 100 each, Rfl: 1  Allergies  Allergen Reactions   Shrimp [Shellfish Allergy] Swelling    I personally reviewed  active problem list, medication list, allergies with the patient/caregiver today.   ROS  Ten systems reviewed and is negative except as mentioned in HPI    Objective Physical Exam CONSTITUTIONAL: Patient appears well-developed and well-nourished.  No distress. HEENT: Head atraumatic, normocephalic, neck supple. CARDIOVASCULAR: Normal rate, regular rhythm and normal heart sounds.  No murmur heard. No BLE edema. PULMONARY: Effort normal and breath sounds normal. No respiratory distress. ABDOMINAL: There is no tenderness or distention. PSYCHIATRIC: Patient has a normal mood and affect. behavior is normal. Judgment and thought content normal.  Vitals:   08/27/24 1518  BP: (!) 142/80  Pulse: 97  Resp: 16  SpO2: 99%  Weight: 205 lb 6.4 oz (93.2 kg)  Height: 6' 1 (1.854 m)    Body mass index is 27.1 kg/m.  Recent Results (from the past 2160 hours)  POCT glycosylated hemoglobin (Hb A1C)     Status: Abnormal   Collection Time: 07/30/24  2:16 PM  Result Value Ref Range   Hemoglobin A1C 14.0 (A) 4.0 - 5.6 %    Comment: >14   HbA1c POC (<> result, manual entry)     HbA1c, POC (prediabetic range)     HbA1c, POC (controlled diabetic range)    Urine Microalbumin w/creat. ratio     Status: Abnormal   Collection Time: 07/30/24  2:49 PM  Result Value Ref Range   Creatinine, Urine 132 20 - 320 mg/dL   Microalb, Ur 884.4 mg/dL    Comment: Verified by repeat analysis. SABRA Reference Range Not established    Microalb Creat Ratio 875 (H) <30 mg/g creat    Comment: . The ADA defines abnormalities in albumin excretion as follows: SABRA Albuminuria Category        Result (mg/g creatinine) . Normal to Mildly increased   <30 Moderately increased         30-299  Severely increased           > OR = 300 . The ADA recommends that at least two of three specimens collected within a 3-6 month period be abnormal before considering a patient to be within a diagnostic category.   Comprehensive  metabolic panel with GFR     Status: Abnormal   Collection Time: 07/30/24  2:49 PM  Result Value Ref Range   Glucose, Bld 399 (H) 65 - 99 mg/dL    Comment: Verified by repeat analysis. SABRA .            Fasting reference interval . For someone without known diabetes, a glucose value >125 mg/dL indicates that they may have diabetes and this should be confirmed with a follow-up test. .    BUN 23 7 - 25 mg/dL   Creat 8.57 (H) 9.29 - 1.28 mg/dL   eGFR  52 (L) > OR = 60 mL/min/1.74m2   BUN/Creatinine Ratio 16 6 - 22 (calc)   Sodium 135 135 - 146 mmol/L   Potassium 4.7 3.5 - 5.3 mmol/L   Chloride 100 98 - 110 mmol/L   CO2 26 20 - 32 mmol/L   Calcium  9.5 8.6 - 10.3 mg/dL   Total Protein 6.9 6.1 - 8.1 g/dL   Albumin 3.8 3.6 - 5.1 g/dL   Globulin 3.1 1.9 - 3.7 g/dL (calc)   AG Ratio 1.2 1.0 - 2.5 (calc)   Total Bilirubin 0.7 0.2 - 1.2 mg/dL   Alkaline phosphatase (APISO) 105 35 - 144 U/L   AST 11 10 - 35 U/L   ALT 12 9 - 46 U/L  Lipid panel     Status: Abnormal   Collection Time: 07/30/24  2:49 PM  Result Value Ref Range   Cholesterol 257 (H) <200 mg/dL   HDL 66 > OR = 40 mg/dL   Triglycerides 866 <849 mg/dL   LDL Cholesterol (Calc) 164 (H) mg/dL (calc)    Comment: Reference range: <100 . Desirable range <100 mg/dL for primary prevention;   <70 mg/dL for patients with CHD or diabetic patients  with > or = 2 CHD risk factors. SABRA LDL-C is now calculated using the Martin-Hopkins  calculation, which is a validated novel method providing  better accuracy than the Friedewald equation in the  estimation of LDL-C.  Gladis APPLETHWAITE et al. SANDREA. 7986;689(80): 2061-2068  (http://education.[facility].com/faq/FAQ164)    Total CHOL/HDL Ratio 3.9 <5.0 (calc)   Non-HDL Cholesterol (Calc) 191 (H) <130 mg/dL (calc)    Comment: For patients with diabetes plus 1 major ASCVD risk  factor, treating to a non-HDL-C goal of <100 mg/dL  (LDL-C of <29 mg/dL) is considered a therapeutic  option.    CBC with Differential/Platelet     Status: Abnormal   Collection Time: 07/30/24  2:49 PM  Result Value Ref Range   WBC 7.6 3.8 - 10.8 Thousand/uL   RBC 5.11 4.20 - 5.80 Million/uL   Hemoglobin 14.5 13.2 - 17.1 g/dL   HCT 54.3 61.4 - 49.9 %   MCV 89.2 80.0 - 100.0 fL   MCH 28.4 27.0 - 33.0 pg   MCHC 31.8 (L) 32.0 - 36.0 g/dL    Comment: For adults, a slight decrease in the calculated MCHC value (in the range of 30 to 32 g/dL) is most likely not clinically significant; however, it should be interpreted with caution in correlation with other red cell parameters and the patient's clinical condition.    RDW 11.8 11.0 - 15.0 %   Platelets 248 140 - 400 Thousand/uL   MPV 10.6 7.5 - 12.5 fL   Neutro Abs 5,145 1,500 - 7,800 cells/uL   Absolute Lymphocytes 1,915 850 - 3,900 cells/uL   Absolute Monocytes 410 200 - 950 cells/uL   Eosinophils Absolute 91 15 - 500 cells/uL   Basophils Absolute 38 0 - 200 cells/uL   Neutrophils Relative % 67.7 %   Total Lymphocyte 25.2 %   Monocytes Relative 5.4 %   Eosinophils Relative 1.2 %   Basophils Relative 0.5 %  VITAMIN D  25 Hydroxy (Vit-D Deficiency, Fractures)     Status: Abnormal   Collection Time: 07/30/24  2:49 PM  Result Value Ref Range   Vit D, 25-Hydroxy 16 (L) 30 - 100 ng/mL    Comment: Vitamin D  Status         25-OH Vitamin D : . Deficiency:                    <  20 ng/mL Insufficiency:             20 - 29 ng/mL Optimal:                 > or = 30 ng/mL . For 25-OH Vitamin D  testing on patients on  D2-supplementation and patients for whom quantitation  of D2 and D3 fractions is required, the QuestAssureD(TM) 25-OH VIT D, (D2,D3), LC/MS/MS is recommended: order  code 07111 (patients >24yrs). . See Note 1 . Note 1 . For additional information, please refer to  http://education.[facility].com/faq/FAQ199  (This link is being provided for informational/ educational purposes only.)   Parathyroid  hormone, intact (no Ca)      Status: None   Collection Time: 07/30/24  2:49 PM  Result Value Ref Range   PTH 76 16 - 77 pg/mL    Comment: . Interpretive Guide    Intact PTH           Calcium  ------------------    ----------           ------- Normal Parathyroid     Normal               Normal Hypoparathyroidism    Low or Low Normal    Low Hyperparathyroidism    Primary            Normal or High       High    Secondary          High                 Normal or Low    Tertiary           High                 High Non-Parathyroid     Hypercalcemia      Low or Low Normal    High .   TSH     Status: None   Collection Time: 07/30/24  2:49 PM  Result Value Ref Range   TSH 1.08 0.40 - 4.50 mIU/L  C-reactive protein     Status: None   Collection Time: 07/30/24  2:49 PM  Result Value Ref Range   CRP 3.3 <8.0 mg/L  Sedimentation rate     Status: Abnormal   Collection Time: 07/30/24  2:49 PM  Result Value Ref Range   Sed Rate 33 (H) 0 - 20 mm/h  PSA     Status: Abnormal   Collection Time: 07/30/24  2:49 PM  Result Value Ref Range   PSA 4.76 (H) < OR = 4.00 ng/mL    Comment: The total PSA value from this assay system is  standardized against the WHO standard. The test  result will be approximately 20% lower when compared  to the equimolar-standardized total PSA (Beckman  Coulter). Comparison of serial PSA results should be  interpreted with this fact in mind. . This test was performed using the Siemens  chemiluminescent method. Values obtained from  different assay methods cannot be used interchangeably. PSA levels, regardless of value, should not be interpreted as absolute evidence of the presence or absence of disease.   HIV Antibody (routine testing w rflx)     Status: None   Collection Time: 07/30/24  2:49 PM  Result Value Ref Range   HIV FINAL INTERPRETATION HIV NEGATIVE     Comment: HIV-1 antigen and HIV-1/HIV-2 antibodies were not detected. There is no laboratory evidence of HIV infection.    HIV 1&2  Ab, 4th Generation NON-REACTIVE NON-REACTIVE    Diabetic Foot Exam:     PHQ2/9:    08/27/2024    3:02 PM 07/30/2024    1:57 PM 09/10/2021    9:50 AM 07/10/2021   11:29 AM 04/08/2020    3:47 PM  Depression screen PHQ 2/9  Decreased Interest 3 0 0 0 0  Down, Depressed, Hopeless 3 0 0 0 0  PHQ - 2 Score 6 0 0 0 0  Altered sleeping 3  0    Tired, decreased energy 3  0    Change in appetite 3  0    Feeling bad or failure about yourself  3  0    Trouble concentrating 3  0    Moving slowly or fidgety/restless 0  0    Suicidal thoughts 0  0    PHQ-9 Score 21  0    Difficult doing work/chores Very difficult        phq 9 is positive, grieving   Fall Risk:    08/27/2024    3:02 PM 07/30/2024    1:57 PM 09/10/2021    9:49 AM 07/10/2021   11:29 AM 04/08/2020    3:50 PM  Fall Risk   Falls in the past year? 0 0 0 0 0  Number falls in past yr: 0 0 0 0 0  Injury with Fall? 0 0 0 0 0  Risk for fall due to : No Fall Risks No Fall Risks No Fall Risks  No Fall Risks  Follow up Falls evaluation completed Falls evaluation completed Falls prevention discussed  Falls evaluation completed  Falls prevention discussed      Data saved with a previous flowsheet row definition     Assessment & Plan Type 2 diabetes mellitus with chronic kidney disease stage 3 Blood glucose improved but A1c remains high at 14. CKD stage 3 stable with improved function. - Order fructosamine test. - Continue Soliqua , titrate to 50 units as needed for fasting glucose <140 mg/dL. - Prescribe Jardiance for glycemic control and renal protection. - Provide Diflucan  for yeast infections due to Jardiance, as needed. - Encourage dietary sugar reduction. - Advise prompt glucose sensor replacement.  Hypertension Blood pressure slightly elevated, possibly stress-related. Jardiance may assist in lowering. - Monitor blood pressure regularly. - Continue current medications and add Jardiance.  Hyperlipidemia LDL increased from  69 to 839. On atorvastatin . - Continue atorvastatin  daily. - Ensure medication refills.  Vitamin D  deficiency Vitamin D  deficiency identified, on supplements. - Continue vitamin D  supplementation.  Elevated prostate specific antigen (PSA) PSA slightly elevated at 4.76, requires follow-up. - Follow up with urologist on October 27.  Grief and stress management Significant grief impacting stress and possibly blood pressure. - Encourage grief counseling through his denomination. - Monitor emotional well-being.  General Health Maintenance Received flu vaccination, discussed importance of health management. - Encourage adherence to vaccinations and health screenings.

## 2024-09-03 ENCOUNTER — Telehealth: Payer: Self-pay

## 2024-09-03 NOTE — Telephone Encounter (Signed)
 Copied from CRM [id number]. Topic: Clinical - Prescription Issue >> Sep 03, 2024  2:18 PM Leonette SQUIBB wrote: Reason for CRM: Patient would like nurse or provider to call him back regarding his glucose monitor and the medication Jardiance.    [phone] or [phone]

## 2024-09-03 NOTE — Telephone Encounter (Signed)
 Patient needs to know what the sliding scale is for the insulin 

## 2024-09-04 NOTE — Telephone Encounter (Signed)
 Called pt- no answer/unable to leave vm.

## 2024-09-17 ENCOUNTER — Ambulatory Visit: Admitting: Urology

## 2024-10-12 ENCOUNTER — Ambulatory Visit: Admitting: Urology

## 2024-10-24 ENCOUNTER — Ambulatory Visit: Admitting: Urology

## 2024-10-24 ENCOUNTER — Encounter: Payer: Self-pay | Admitting: Urology

## 2024-10-24 VITALS — BP 158/73 | HR 103 | Ht 73.5 in | Wt 205.0 lb

## 2024-10-24 DIAGNOSIS — R972 Elevated prostate specific antigen [PSA]: Secondary | ICD-10-CM

## 2024-10-24 NOTE — Progress Notes (Signed)
 10/24/2024 11:32 AM   [name] 1951-05-30 [id number]  Referring provider: Glenard Mire, MD [address] [place],  [place] [address]  Chief Complaint  Patient presents with   Elevated PSA    HPI: Austin Ortega is a 73 y.o. male referred for evaluation of an elevated PSA.  PSA levels: 4.76 on 07/30/2024 Prior PSA level: 0.5 04/20/2016 Lower urinary tract symptoms: No bothersome LUTS Family history prostate cancer first-degree relatives: Negative  Past urologic history: Negative  PMH: Past Medical History:  Diagnosis Date   Decreased libido    Diabetes mellitus without complication (HCC)    Hyperlipidemia    Hypertension     Surgical History: Past Surgical History:  Procedure Laterality Date   COLONOSCOPY WITH PROPOFOL  N/A 03/13/2020   Procedure: COLONOSCOPY WITH PROPOFOL ;  Surgeon: Therisa Bi, MD;  Location: [facility] ENDOSCOPY;  Service: Gastroenterology;  Laterality: N/A;   NO PAST SURGERIES      Home Medications:  Allergies as of 10/24/2024       Reactions   Shrimp Extract Anaphylaxis   Shrimp [shellfish Allergy] Swelling        Medication List        Accurate as of October 24, 2024 11:32 AM. If you have any questions, ask your nurse or doctor.          aspirin EC 81 MG tablet Take by mouth.   atorvastatin  40 MG tablet Commonly known as: LIPITOR Take 1 tablet (40 mg total) by mouth daily.   empagliflozin  25 MG Tabs tablet Commonly known as: JARDIANCE  Take 1 tablet (25 mg total) by mouth daily.   fluconazole  150 MG tablet Commonly known as: DIFLUCAN  Take 1 tablet (150 mg total) by mouth every other day.   FreeStyle Libre 2 Sensor Misc 1 each by Does not apply route daily.   Insulin  Pen Needle 32G X 6 MM Misc 1 each by Does not apply route daily.   Soliqua  100-33 UNT-MCG/ML Sopn Generic drug: Insulin  Glargine-Lixisenatide Inject 15-50 Units into the skin daily before breakfast.        Allergies:  Allergies  Allergen  Reactions   Shrimp Extract Anaphylaxis   Shrimp [Shellfish Allergy] Swelling    Family History: Family History  Problem Relation Age of Onset   Diabetes Mother    Hypertension Mother    Cancer Father        Lung   Hypertension Sister    Diabetes Sister    Stroke Sister    Heart failure Brother    Hypertension Brother    Hypertension Brother    Heart block Brother    Brain cancer Son     Social History:  reports that he quit smoking about 43 years ago. His smoking use included cigarettes. He started smoking about 46 years ago. He has a 1.5 pack-year smoking history. He has never used smokeless tobacco. He reports that he does not drink alcohol and does not use drugs.   Physical Exam: BP (!) 158/73   Pulse (!) 103   Ht 6' 1.5 (1.867 m)   Wt 205 lb (93 kg)   BMI 26.68 kg/m   Constitutional:  Alert and oriented, No acute distress. HEENT: [place] AT Respiratory: Normal respiratory effort, no increased work of breathing. GU: Prostate boggy, 30 g, smooth without nodules or induration Psychiatric: Normal mood and affect.   Assessment & Plan:    1.  Elevated PSA Although PSA is a prostate cancer screening test he was informed that cancer  is not the most common cause of an elevated PSA. Other potential causes including BPH and inflammation were discussed.  He was informed that the only way to adequately diagnose prostate cancer would be transrectal ultrasound and biopsy of the prostate. The procedure was discussed including potential risks of bleeding and infection/sepsis. He was also informed that a negative biopsy does not conclusively rule out the possibility that prostate cancer may be present and that continued monitoring is required.  The use of newer adjunctive blood and urine tests to predict the probability of high-grade prostate cancer were discussed. The use of multiparametric prostate MRI to evaluate for lesions suspicious for high grade prostate cancer and aid in targeted  bx was reviewed.  Continued periodic surveillance was also discussed. Last PSA was ~3 months ago and a PHI was drawn today.  He will be notified with results and further recommendations   Glendia JAYSON Barba, MD  [facility] [address] [place], [place] [address] [phone]

## 2024-10-26 LAB — PROSTATE HEALTH INDEX: Prostate Specific Ag: 0.6 ng/mL (ref 0.0–3.9)

## 2024-10-26 LAB — REFLEX INFORMATION

## 2024-11-03 ENCOUNTER — Ambulatory Visit: Payer: Self-pay | Admitting: Urology
